# Patient Record
Sex: Female | Born: 2000 | Race: Black or African American | Hispanic: No | Marital: Single | Smoking: Never smoker
Health system: Southern US, Community
[De-identification: ages and names within clinical notes are randomized; demographics above are authoritative.]

## PROBLEM LIST (undated history)

## (undated) ENCOUNTER — Inpatient Hospital Stay (HOSPITAL_COMMUNITY): Payer: Self-pay

## (undated) DIAGNOSIS — A749 Chlamydial infection, unspecified: Secondary | ICD-10-CM

## (undated) DIAGNOSIS — J302 Other seasonal allergic rhinitis: Secondary | ICD-10-CM

## (undated) DIAGNOSIS — R51 Headache: Secondary | ICD-10-CM

## (undated) DIAGNOSIS — K141 Geographic tongue: Secondary | ICD-10-CM

## (undated) DIAGNOSIS — F419 Anxiety disorder, unspecified: Secondary | ICD-10-CM

## (undated) DIAGNOSIS — R519 Headache, unspecified: Secondary | ICD-10-CM

## (undated) DIAGNOSIS — O139 Gestational [pregnancy-induced] hypertension without significant proteinuria, unspecified trimester: Secondary | ICD-10-CM

## (undated) DIAGNOSIS — F32A Depression, unspecified: Secondary | ICD-10-CM

## (undated) HISTORY — PX: EYE SURGERY: SHX253

---
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
## 2020-01-08 ENCOUNTER — Other Ambulatory Visit: Payer: Self-pay

## 2020-01-08 ENCOUNTER — Encounter (HOSPITAL_COMMUNITY): Payer: Self-pay

## 2020-01-08 ENCOUNTER — Ambulatory Visit (HOSPITAL_COMMUNITY)
Admission: EM | Admit: 2020-01-08 | Discharge: 2020-01-08 | Disposition: A | Discharge status: Home / Self Care | Payer: Medicaid Other | Attending: Family Medicine | Admitting: Family Medicine

## 2020-01-08 DIAGNOSIS — Z3202 Encounter for pregnancy test, result negative: Secondary | ICD-10-CM

## 2020-01-08 DIAGNOSIS — R1030 Lower abdominal pain, unspecified: Secondary | ICD-10-CM | POA: Insufficient documentation

## 2020-01-08 DIAGNOSIS — R300 Dysuria: Secondary | ICD-10-CM

## 2020-01-08 DIAGNOSIS — Z113 Encounter for screening for infections with a predominantly sexual mode of transmission: Secondary | ICD-10-CM | POA: Diagnosis not present

## 2020-01-08 DIAGNOSIS — Z202 Contact with and (suspected) exposure to infections with a predominantly sexual mode of transmission: Secondary | ICD-10-CM | POA: Diagnosis not present

## 2020-01-08 LAB — POCT URINALYSIS DIPSTICK, ED / UC
Bilirubin Urine: NEGATIVE
Glucose, UA: NEGATIVE mg/dL
Hgb urine dipstick: NEGATIVE
Ketones, ur: NEGATIVE mg/dL
Nitrite: NEGATIVE
Protein, ur: NEGATIVE mg/dL
Specific Gravity, Urine: >=1.03 (ref 1.005–1.030)
Urobilinogen, UA: 0.2 mg/dL (ref 0.0–1.0)
pH: 6 (ref 5.0–8.0)

## 2020-01-08 LAB — POC URINE PREG, ED: Preg Test, Ur: NEGATIVE

## 2020-01-08 NOTE — ED Provider Notes (Signed)
[facility]    CSN: [id number] Arrival date & time: 01/08/20  1275      History   Chief Complaint Chief Complaint  Patient presents with   Dysuria   Exposure to STD    HPI KALLA WATSON is a 19 y.o. female.   Pt is an 19 year old female that presents with intermittent lower abdominal cramping over the past weeks.  History of UTIs and feels similar.  Otherwise denies any other associated symptoms to include dysuria, hematuria or urinary frequency.  Denies any vaginal discharge, odor, itching or irritation.  Some concern for STDs and would like to be checked.  Not currently having any discomfort.  No fevers, chills, nausea, vomiting or flank pain.     Past Medical History:  Diagnosis Date   Geographical tongue    Headache    Seasonal allergies     Patient Active Problem List   Diagnosis Date Noted   History of pre-eclampsia 12/09/2018    Past Surgical History:  Procedure Laterality Date   EYE SURGERY      OB History    Gravida  1   Para  1   Term  1   Preterm      AB      Living  1     SAB      TAB      Ectopic      Multiple  0   Live Births  1            Home Medications    Prior to Admission medications   Medication Sig Start Date End Date Taking? Authorizing Provider  ibuprofen (ADVIL) 600 MG tablet Take 1 tablet (600 mg total) by mouth every 6 (six) hours as needed for mild pain, moderate pain or cramping. Patient not taking: Reported on 12/09/2018 11/09/18   Cheral Marker, CNM  norelgestromin-ethinyl estradiol (ORTHO EVRA) 150-35 MCG/24HR transdermal patch Place 1 patch onto the skin once a week. 12/09/18   Marny Lowenstein, PA-C  Prenat w/o A-FeCbGl-DSS-FA-DHA [facility] ASSURE) 35-1 & 300 MG tablet One tablet and one capsule daily 11/09/18   Cheral Marker, CNM    Family History Family History  Problem Relation Age of Onset   Healthy Mother    Healthy Father    Hypertension Paternal Grandfather      Social History Social History   Tobacco Use   Smoking status: Never Smoker   Smokeless tobacco: Never Used  Building services engineer Use: Never used  Substance Use Topics   Alcohol use: Never   Drug use: Yes    Types: Marijuana     Allergies   Other   Review of Systems Review of Systems   Physical Exam Triage Vital Signs ED Triage Vitals [01/08/20 1020]  Enc Vitals Group     BP 110/72     Pulse Rate 85     Resp 16     Temp 98.2 F (36.8 C)     Temp src      SpO2 100 %     Weight      Height      Head Circumference      Peak Flow      Pain Score      Pain Loc      Pain Edu?      Excl. in GC?    No data found.  Updated Vital Signs BP 110/72    Pulse 85  Temp 98.2 F (36.8 C)    Resp 16    LMP  (LMP Unknown)    SpO2 100%   Visual Acuity Right Eye Distance:   Left Eye Distance:   Bilateral Distance:    Right Eye Near:   Left Eye Near:    Bilateral Near:     Physical Exam Vitals and nursing note reviewed.  Constitutional:      General: She is not in acute distress.    Appearance: Normal appearance. She is not ill-appearing, toxic-appearing or diaphoretic.  HENT:     Head: Normocephalic.     Nose: Nose normal.  Eyes:     Conjunctiva/sclera: Conjunctivae normal.  Pulmonary:     Effort: Pulmonary effort is normal.  Abdominal:     Palpations: Abdomen is soft.  Musculoskeletal:        General: Normal range of motion.     Cervical back: Normal range of motion.  Skin:    General: Skin is warm and dry.     Findings: No rash.  Neurological:     Mental Status: She is alert.  Psychiatric:        Mood and Affect: Mood normal.      UC Treatments / Results  Labs (all labs ordered are listed, but only abnormal results are displayed) Labs Reviewed  POCT URINALYSIS DIPSTICK, ED / UC - Abnormal; Notable for the following components:      Result Value   Leukocytes,Ua LARGE (*)    All other components within normal limits  URINE CULTURE    POC URINE PREG, ED  CERVICOVAGINAL ANCILLARY ONLY    EKG   Radiology No results found.  Procedures Procedures (including critical care time)  Medications Ordered in UC Medications - No data to display  Initial Impression / Assessment and Plan / UC Course  I have reviewed the triage vital signs and the nursing notes.  Pertinent labs & imaging results that were available during my care of the patient were reviewed by me and considered in my medical decision making (see chart for details).     Lower abdominal discomfort, screening for STDs. Urine with large leuks here.  Sending for culture.  Will treat based on culture Swab sent for STD screening Labs pending Final Clinical Impressions(s) / UC Diagnoses   Final diagnoses:  Screening examination for STD (sexually transmitted disease)  Lower abdominal pain     Discharge Instructions     I am sending a urine for culture and we are sending a swab for testing for STDs, bacteria and yeast.  I will call you with any positive results.     ED Prescriptions    None     PDMP not reviewed this encounter.   Janace Aris, NP 01/08/20 1122

## 2020-01-08 NOTE — ED Triage Notes (Signed)
Pt c/o lower abdominal pain x 2 weeks, hx of UTI's, states feels the same.  Requesting STD testing, denies symptoms.

## 2020-01-08 NOTE — Discharge Instructions (Signed)
I am sending a urine for culture and we are sending a swab for testing for STDs, bacteria and yeast.  I will call you with any positive results.

## 2020-01-10 ENCOUNTER — Telehealth (HOSPITAL_COMMUNITY): Payer: Self-pay | Admitting: Emergency Medicine

## 2020-01-10 LAB — URINE CULTURE: Culture: >=100000 — AB

## 2020-01-10 MED ORDER — NITROFURANTOIN MONOHYD MACRO 100 MG PO CAPS: 100.0000 mg | ORAL_CAPSULE | Freq: Two times a day (BID) | ORAL | 0 refills | Status: DC

## 2020-01-11 ENCOUNTER — Telehealth (HOSPITAL_COMMUNITY): Payer: Self-pay

## 2020-01-11 DIAGNOSIS — B9689 Other specified bacterial agents as the cause of diseases classified elsewhere: Secondary | ICD-10-CM

## 2020-01-11 LAB — CERVICOVAGINAL ANCILLARY ONLY
Bacterial Vaginitis (gardnerella): POSITIVE — AB
Chlamydia: NEGATIVE
Comment: NEGATIVE
Comment: NEGATIVE
Comment: NEGATIVE
Comment: NEGATIVE
Comment: NEGATIVE
Comment: NORMAL
Neisseria Gonorrhea: NEGATIVE

## 2020-01-11 MED ORDER — METRONIDAZOLE 500 MG PO TABS: 500.0000 mg | ORAL_TABLET | Freq: Two times a day (BID) | ORAL | 0 refills | Status: DC

## 2020-01-11 NOTE — Telephone Encounter (Signed)
Attempted to reach patient. No answer at this time. Voicemail left.   Bacterial vaginosis is positive. Pt needs treatment. Flagyl 500 mg BID x 7 days #14 no refills sent to patients pharmacy of choice.

## 2020-02-15 IMAGING — US US MFM OB FOLLOW-UP
1 series · 14 of 28 positions shown · non-contrast
Comparison: none

[Series 1: us mfm ob follow-up · 14 of 39 slices shown]
[im 2/39]
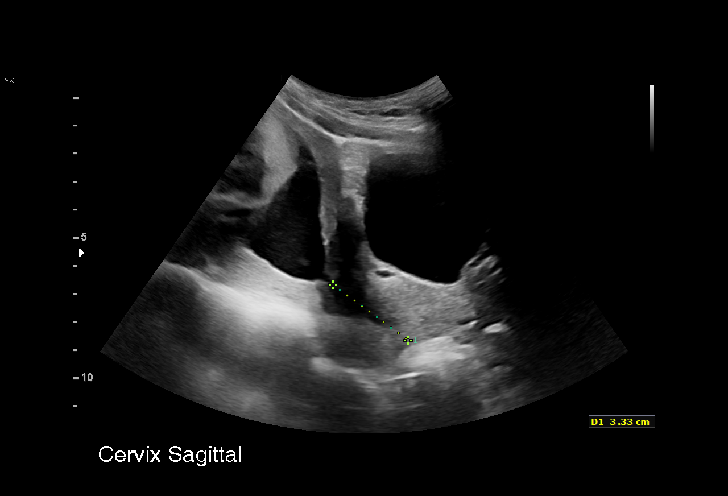
[im 5/39]
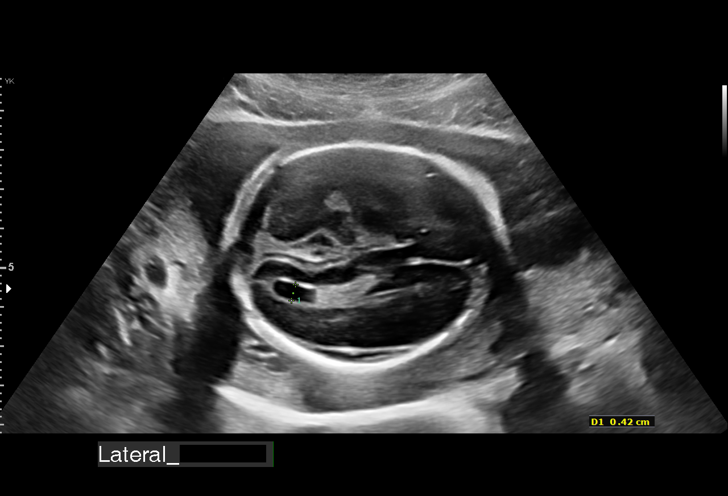
[im 8/39]
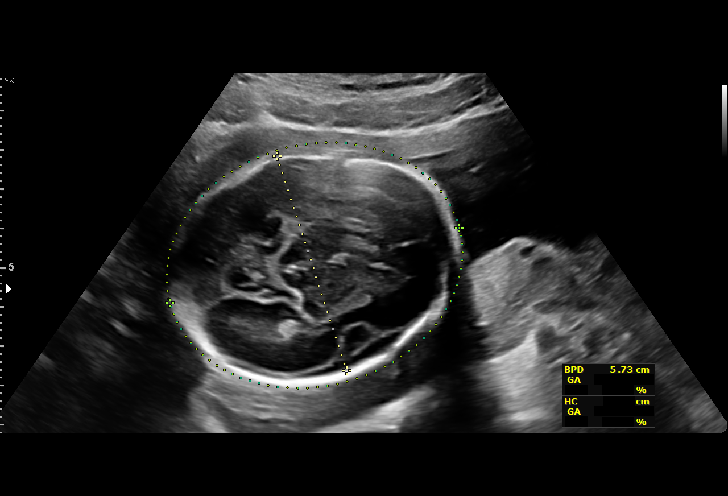
[im 10/39]
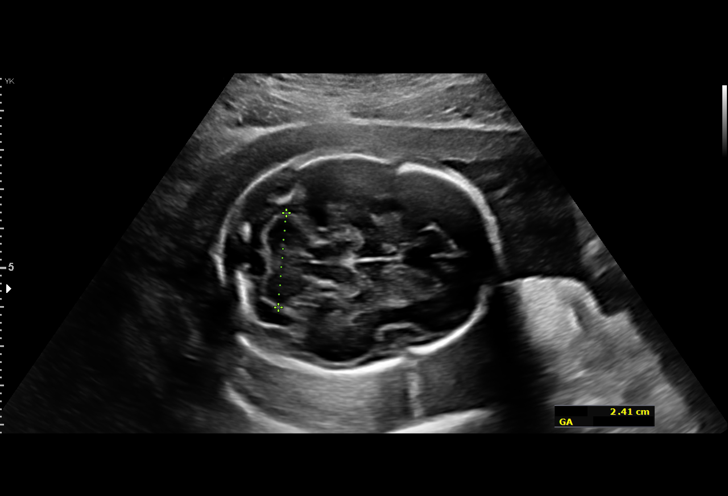
[im 13/39]
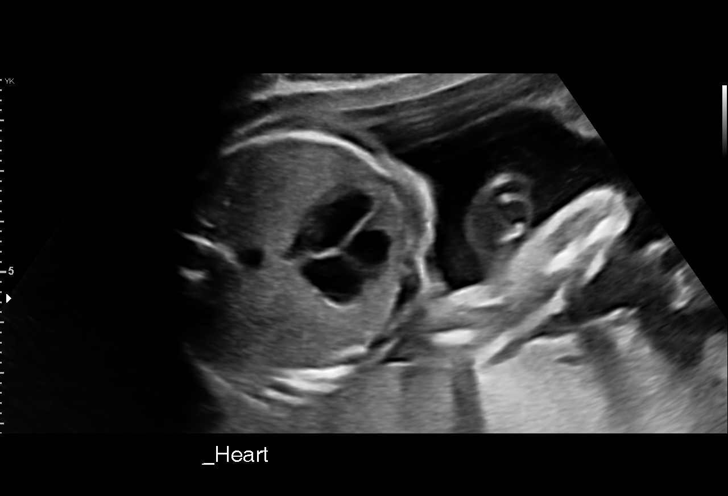
[im 16/39]
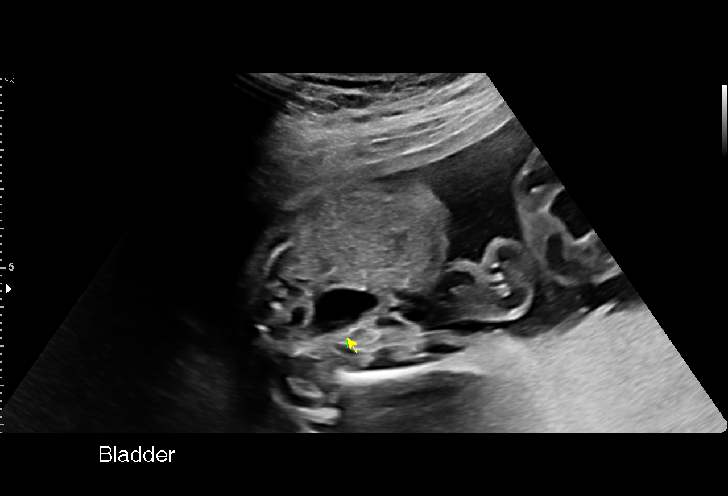
[im 19/39]
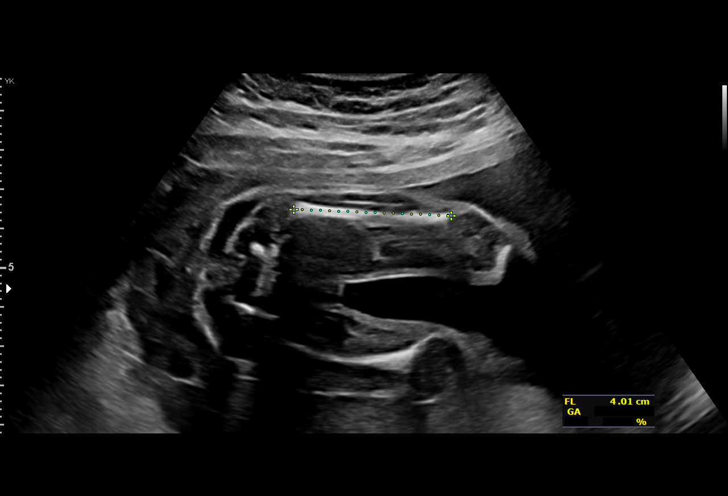
[im 22/39]
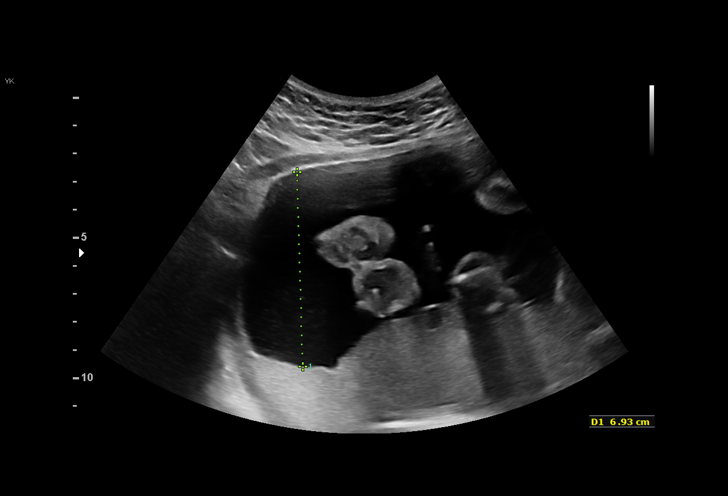
[im 24/39]
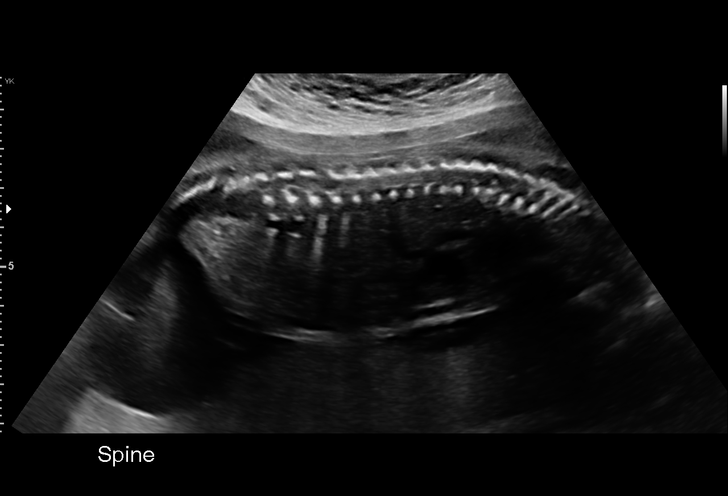
[im 27/39]
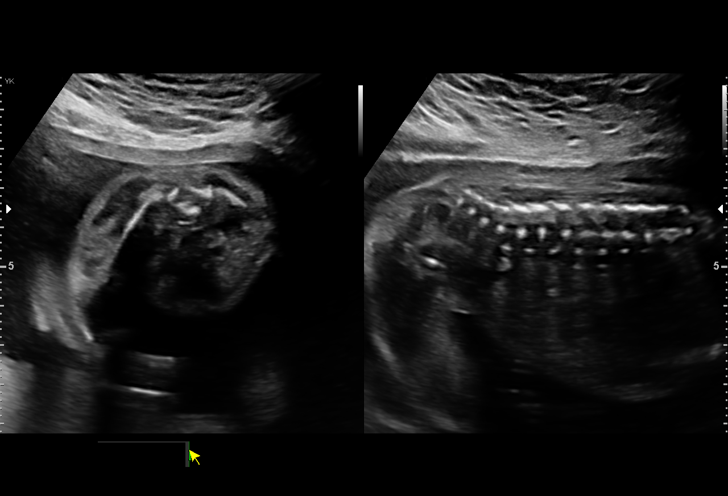
[im 30/39]
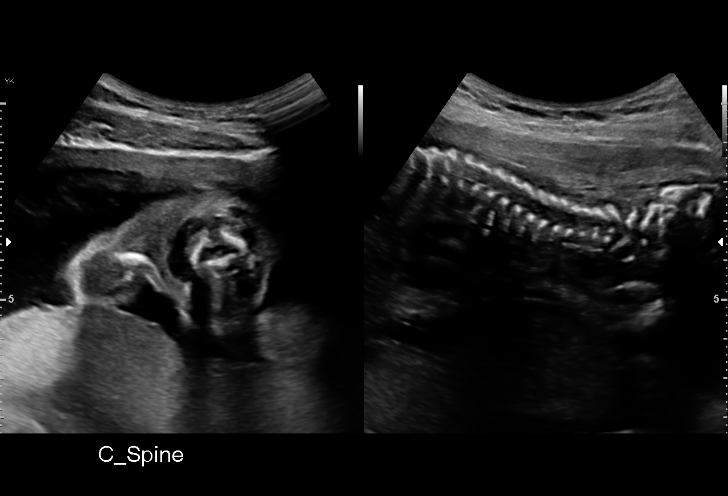
[im 33/39]
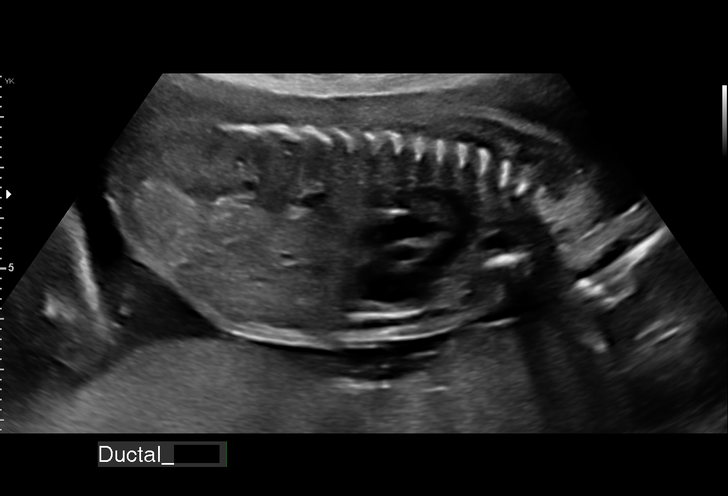
[im 36/39]
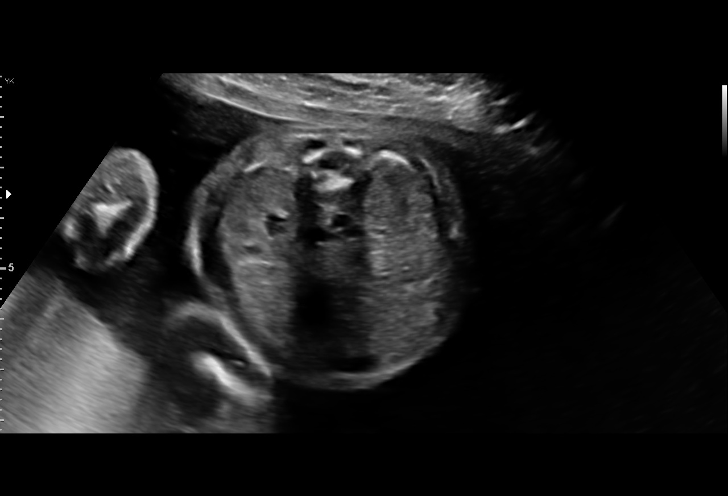
[im 39/39]
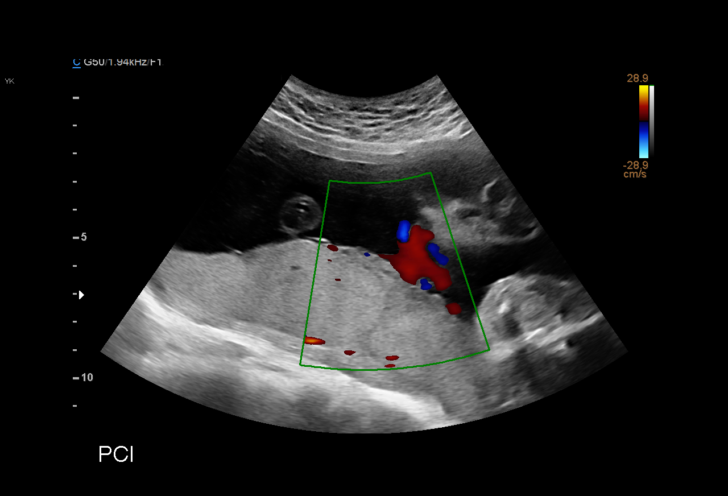

[14 of 28 positions shown; findings below may reference images not displayed]

BLAIN

 ----------------------------------------------------------------------

 ----------------------------------------------------------------------
Indications

  Antenatal follow-up for nonvisualized fetal
  anatomy
  Teen pregnancy
  23 weeks gestation of pregnancy
 ----------------------------------------------------------------------
Vital Signs

                                                 Height:        5'2"
Fetal Evaluation

 Num Of Fetuses:          1
 Fetal Heart Rate(bpm):   150
 Cardiac Activity:        Observed
 Presentation:            Cephalic
 Placenta:                Posterior
 P. Cord Insertion:       Visualized, central

 Amniotic Fluid
 AFI FV:      Within normal limits

                             Largest Pocket(cm)

Biometry

 BPD:      56.4   mm     G. Age:  23w 2d         54  %    CI:         69.15  %    70 - 86
                                                          FL/HC:       18.8  %    19.2 -
 HC:      216.6   mm     G. Age:  23w 5d         63  %    HC/AC:       1.16       1.05 -
 AC:      186.9   mm     G. Age:  23w 3d         57  %    FL/BPD:      72.3  %    71 - 87
 FL:       40.8   mm     G. Age:  23w 2d         46  %    FL/AC:       21.8  %    20 - 24
 CER:      24.1   mm     G. Age:  22w 1d         37  %

 Est. FW:     590   gm      1 lb 5 oz     59  %
OB History

 Gravidity:     1
Gestational Age

 LMP:            23w 0d       Date:  02/11/18                   EDD:  11/18/18
 U/S Today:      23w 3d                                         EDD:  11/15/18
 Best:           23w 0d    Det. By:  LMP  (02/11/18)            EDD:  11/18/18
Anatomy

 Cranium:                Previously seen        Aortic Arch:            Previously seen
 Cavum:                  Previously seen        Ductal Arch:            Appears normal
 Ventricles:             Appears normal         Diaphragm:              Previously seen
 Choroid Plexus:         Previously seen        Stomach:                Appears normal, left
                                                                        sided
 Cerebellum:             Appears normal         Abdomen:                Previously seen
 Posterior Fossa:        Previously seen        Abdominal Wall:         Previously seen
 Nuchal Fold:            Not applicable (>20    Cord Vessels:           Previously seen
                         wks GA)
 Face:                   Appears normal         Kidneys:                Appear normal
                         (orbits and profile)
 Lips:                   Previously seen        Bladder:                Appears normal
 Thoracic:               Appears normal         Spine:                  Appears normal
                         Previously seen
 Heart:                  Appears normal         Upper Extremities:      Previously seen
                         (4CH, axis, and situs)
 RVOT:                   Previously seen        Lower Extremities:      Previously seen
 LVOT:                   Previously seen

 Other:   Male gender. Heels, 5th digit and open hands previously visualized.
Cervix Uterus Adnexa

 Cervix
 Length:             3.3  cm.
 Normal appearance by transabdominal scan.
Impression

 Normal interval growth.
Recommendations

 Follow up as clinically indicated.

## 2020-11-28 ENCOUNTER — Other Ambulatory Visit: Payer: Self-pay

## 2020-11-28 ENCOUNTER — Ambulatory Visit (HOSPITAL_COMMUNITY)
Admission: EM | Admit: 2020-11-28 | Discharge: 2020-11-28 | Disposition: A | Discharge status: Home / Self Care | Payer: Medicaid Other

## 2020-11-28 DIAGNOSIS — Z5321 Procedure and treatment not carried out due to patient leaving prior to being seen by health care provider: Secondary | ICD-10-CM

## 2020-11-28 NOTE — ED Notes (Addendum)
Pt was called four times, I spoke with her Mother Ardine Eng and she stated that her daughter left the campus. She reported to me that the daughter was on her way back.  I informed her that the daughter will have to start the process over because we have called her four times.

## 2020-11-28 NOTE — ED Notes (Signed)
No answer

## 2020-11-30 ENCOUNTER — Encounter (HOSPITAL_COMMUNITY): Payer: Self-pay | Admitting: Emergency Medicine

## 2020-11-30 ENCOUNTER — Other Ambulatory Visit: Payer: Self-pay

## 2020-11-30 ENCOUNTER — Emergency Department (HOSPITAL_COMMUNITY)
Admission: EM | Admit: 2020-11-30 | Discharge: 2020-11-30 | Disposition: A | Discharge status: Home / Self Care | Payer: Medicaid Other | Attending: Emergency Medicine | Admitting: Emergency Medicine

## 2020-11-30 ENCOUNTER — Ambulatory Visit
Admission: EM | Admit: 2020-11-30 | Discharge: 2020-11-30 | Discharge status: Against Medical Advice | Payer: Medicaid Other

## 2020-11-30 DIAGNOSIS — R1031 Right lower quadrant pain: Secondary | ICD-10-CM | POA: Diagnosis not present

## 2020-11-30 DIAGNOSIS — Z5321 Procedure and treatment not carried out due to patient leaving prior to being seen by health care provider: Secondary | ICD-10-CM | POA: Insufficient documentation

## 2020-11-30 LAB — COMPREHENSIVE METABOLIC PANEL
ALT: 13 U/L (ref 0–44)
AST: 20 U/L (ref 15–41)
Albumin: 3.9 g/dL (ref 3.5–5.0)
Alkaline Phosphatase: 51 U/L (ref 38–126)
Anion gap: 7 (ref 5–15)
BUN: 12 mg/dL (ref 6–20)
CO2: 24 mmol/L (ref 22–32)
Calcium: 9.5 mg/dL (ref 8.9–10.3)
Chloride: 105 mmol/L (ref 98–111)
Creatinine, Ser: 0.62 mg/dL (ref 0.44–1.00)
GFR, Estimated: >60 mL/min (ref >60)
Glucose, Bld: 82 mg/dL (ref 70–99)
Potassium: 3.6 mmol/L (ref 3.5–5.1)
Sodium: 136 mmol/L (ref 135–145)
Total Bilirubin: 0.6 mg/dL (ref 0.3–1.2)
Total Protein: 7.7 g/dL (ref 6.5–8.1)

## 2020-11-30 LAB — CBC
HCT: 37.2 % (ref 36.0–46.0)
Hemoglobin: 11.8 g/dL — ABNORMAL LOW (ref 12.0–15.0)
MCH: 28.8 pg (ref 26.0–34.0)
MCHC: 31.7 g/dL (ref 30.0–36.0)
MCV: 90.7 fL (ref 80.0–100.0)
Platelets: 247 10*3/uL (ref 150–400)
RBC: 4.1 MIL/uL (ref 3.87–5.11)
RDW: 12.7 % (ref 11.5–15.5)
WBC: 9.6 10*3/uL (ref 4.0–10.5)
nRBC: 0 % (ref 0.0–0.2)

## 2020-11-30 LAB — I-STAT BETA HCG BLOOD, ED (MC, WL, AP ONLY): I-stat hCG, quantitative: <5 m[IU]/mL (ref <5)

## 2020-11-30 LAB — URINALYSIS, ROUTINE W REFLEX MICROSCOPIC
Bilirubin Urine: NEGATIVE
Glucose, UA: NEGATIVE mg/dL
Hgb urine dipstick: NEGATIVE
Ketones, ur: NEGATIVE mg/dL
Nitrite: POSITIVE — AB
Protein, ur: NEGATIVE mg/dL
Specific Gravity, Urine: 1.023 (ref 1.005–1.030)
pH: 5 (ref 5.0–8.0)

## 2020-11-30 LAB — LIPASE, BLOOD: Lipase: 28 U/L (ref 11–51)

## 2020-11-30 NOTE — ED Notes (Signed)
Pt called x2 for vitals with no response and not visible in lobby or outside on benches

## 2020-11-30 NOTE — ED Triage Notes (Signed)
Pt c/o RLQ abdominal pain with tenderness on palpation x 3 days. Still has appendix. Denies NVD, Denies urinary problems. Denies GI HX, no abd surgeries. Triage VS WDL.     Pt would like to be tested for STDs. No symptoms or concerns, no vaginal discharge/bleeding. States she likes to ask with every ED visit.

## 2020-11-30 NOTE — ED Notes (Signed)
Patient called for room assignment x1 with no response 

## 2020-11-30 NOTE — ED Notes (Signed)
Explained our imaging limitations and pt wants to have eval in ED for appendix pain

## 2021-01-13 ENCOUNTER — Ambulatory Visit (INDEPENDENT_AMBULATORY_CARE_PROVIDER_SITE_OTHER): Payer: Medicaid Other | Admitting: Obstetrics and Gynecology

## 2021-01-13 ENCOUNTER — Other Ambulatory Visit: Payer: Self-pay

## 2021-01-13 ENCOUNTER — Encounter: Payer: Self-pay | Admitting: Obstetrics and Gynecology

## 2021-01-13 VITALS — BP 116/79 | HR 76 | Ht 62.0 in | Wt 152.0 lb

## 2021-01-13 DIAGNOSIS — Z3009 Encounter for other general counseling and advice on contraception: Secondary | ICD-10-CM | POA: Diagnosis not present

## 2021-01-13 DIAGNOSIS — Z01419 Encounter for gynecological examination (general) (routine) without abnormal findings: Secondary | ICD-10-CM

## 2021-01-13 DIAGNOSIS — Z3045 Encounter for surveillance of transdermal patch hormonal contraceptive device: Secondary | ICD-10-CM

## 2021-01-13 LAB — POCT URINE PREGNANCY: Preg Test, Ur: NEGATIVE

## 2021-01-13 MED ORDER — XULANE 150-35 MCG/24HR TD PTWK: 1.0000 | MEDICATED_PATCH | TRANSDERMAL | 4 refills | Status: DC

## 2021-01-13 NOTE — Progress Notes (Signed)
GYNECOLOGY ANNUAL PREVENTATIVE CARE ENCOUNTER NOTE  History:     Sonya Dunn is a 20 y.o. G58P1001 female here for a routine annual gynecologic exam/birth control consult.  Current complaints: none - she would like to start birth control. She has done the patch in the past and would like to resume this.   Denies abnormal vaginal bleeding, discharge, pelvic pain, problems with intercourse or other gynecologic concerns.      Gynecologic History Patient's last menstrual period was 01/07/2021. Contraception: none Last Pap: not yet done - 20 yo.  She has not had Gardasil.  The following portions of the patient's history were reviewed and updated as appropriate: allergies, current medications, past family history, past medical history, past social history, past surgical history and problem list.  Review of Systems Pertinent items noted in HPI and remainder of comprehensive ROS otherwise negative.  Physical Exam:  BP 116/79   Pulse 76   Ht 5\' 2"  (1.575 m)   Wt 152 lb (68.9 kg)   LMP 01/07/2021   BMI 27.80 kg/m  CONSTITUTIONAL: Well-developed, well-nourished female in no acute distress.  HENT:  Normocephalic, atraumatic, External right and left ear normal.  EYES: Conjunctivae and EOM are normal. Pupils are equal, round, and reactive to light. No scleral icterus.  NECK: Normal range of motion, supple, no masses.  Normal thyroid.  SKIN: Skin is warm and dry. No rash noted. Not diaphoretic. No erythema. No pallor. MUSCULOSKELETAL: Normal range of motion. No tenderness.  No cyanosis, clubbing, or edema. NEUROLOGIC: Alert and oriented to person, place, and time. Normal reflexes, muscle tone coordination.  PSYCHIATRIC: Normal mood and affect. Normal behavior. Normal judgment and thought content.  CARDIOVASCULAR: Normal heart rate noted, regular rhythm RESPIRATORY: Clear to auscultation bilaterally. Effort and breath sounds normal, no problems with respiration noted.  BREASTS:  Symmetric in size. No masses, tenderness, skin changes, nipple drainage, or lymphadenopathy bilaterally. Performed in the presence of a chaperone. ABDOMEN: Soft, no distention noted.  No tenderness, rebound or guarding.  PELVIC: deferred. Performed in the presence of a chaperone.    Assessment and Plan:    1. Counseling for initiation of birth control method - Reviewed different types of birth control available: OCPs, vaginal ring, Nexplanon, Depo, various types of IUDs, permanent sterilization.  We reviewed the advantages and risks of each (particularly risk of VTE with estrogen containing options). We discussed side effects of each. - Reviewed that birth control does not protect against STI. Condoms reduce the risk of transmission but are not 100% effective especially for HSV and HIV. - Patient has tried:  patch - Patient desires:  patch - POCT urine pregnancy negative - norelgestromin-ethinyl estradiol 01/09/2021) 150-35 MCG/24HR transdermal patch; Place 1 patch onto the skin once a week.  Dispense: 12 patch; Refill: 4  2. Gyne annual - Cervical cancer screening: Discussed purpose of pap. Recommended first pap at age 63. Discussed screening Q3 years. Reviewed importance of annual exams and limits of pap smear. - Gardasil: has not yet had. Counseling provided. Information given for home review. Recommended vaccination. She will consider.  - Birth Control: Discussed options and their risks, benefits and common side effects; discussed VTE with estrogen containing options. Desires:  patch - Breast Health: Encouraged self breast awareness/exams - teaching provided. - Follow-up: 12 months and prn  Routine preventative health maintenance measures emphasized. Please refer to After Visit Summary for other counseling recommendations.      36, MD, FACOG Obstetrician & Gynecologist, Faculty  [facility] [employer], [place]

## 2021-01-13 NOTE — Progress Notes (Signed)
Pt states she is interested in Patch for [facility], pt is on cycle now- started last week. Pt states last intercourse beginning of August.   UPT is Negative in office today.

## 2021-02-03 ENCOUNTER — Ambulatory Visit: Payer: Medicaid Other | Admitting: Family Medicine

## 2021-02-20 ENCOUNTER — Other Ambulatory Visit: Payer: Self-pay

## 2021-02-20 ENCOUNTER — Ambulatory Visit (INDEPENDENT_AMBULATORY_CARE_PROVIDER_SITE_OTHER): Payer: Medicaid Other

## 2021-02-20 DIAGNOSIS — Z3200 Encounter for pregnancy test, result unknown: Secondary | ICD-10-CM

## 2021-02-20 DIAGNOSIS — Z3202 Encounter for pregnancy test, result negative: Secondary | ICD-10-CM

## 2021-02-20 DIAGNOSIS — R309 Painful micturition, unspecified: Secondary | ICD-10-CM

## 2021-02-20 DIAGNOSIS — R300 Dysuria: Secondary | ICD-10-CM

## 2021-02-20 LAB — POCT URINE PREGNANCY: Preg Test, Ur: NEGATIVE

## 2021-02-20 NOTE — Progress Notes (Signed)
Pt is in the office for UPT for possible pregnancy. UPT is negative, pt is on the [facility] patch but states that last cycle was different. Advised pt on how to properly use/change BC patches, and if cycle does not come on, take a home UPT, pt agreed. Pt also reports pain with urination, advised would send urine culture.

## 2021-02-20 NOTE — Progress Notes (Signed)
Patient was assessed and managed by nursing staff during this encounter. I have reviewed the chart and agree with the documentation and plan. I have also made any necessary editorial changes.  Catalina Antigua, MD 02/20/2021 3:24 PM

## 2021-02-24 LAB — URINE CULTURE

## 2021-03-29 ENCOUNTER — Ambulatory Visit (HOSPITAL_COMMUNITY)
Admission: EM | Admit: 2021-03-29 | Discharge: 2021-03-29 | Disposition: A | Discharge status: Home / Self Care | Payer: Medicaid Other | Attending: Family Medicine | Admitting: Family Medicine

## 2021-03-29 ENCOUNTER — Other Ambulatory Visit: Payer: Self-pay

## 2021-03-29 ENCOUNTER — Encounter (HOSPITAL_COMMUNITY): Payer: Self-pay

## 2021-03-29 DIAGNOSIS — J069 Acute upper respiratory infection, unspecified: Secondary | ICD-10-CM

## 2021-03-29 MED ORDER — OSELTAMIVIR PHOSPHATE 75 MG PO CAPS: 75.0000 mg | ORAL_CAPSULE | Freq: Two times a day (BID) | ORAL | 0 refills | Status: DC

## 2021-03-29 MED ORDER — PROMETHAZINE-DM 6.25-15 MG/5ML PO SYRP: 5.0000 mL | ORAL_SOLUTION | Freq: Four times a day (QID) | ORAL | 0 refills | Status: DC | PRN

## 2021-03-29 NOTE — ED Provider Notes (Signed)
[facility]    CSN: [id number] Arrival date & time: 03/29/21  1350      History   Chief Complaint Chief Complaint  Patient presents with   Sore Throat   Fever   Headache    HPI Sonya Dunn is a 20 y.o. female.   Presenting today with 1 day history of fever, chills, body aches, sore throat, cough, congestion.  Denies chest pain, shortness of breath, abdominal pain, nausea vomiting or diarrhea.  No known chronic medical problems.  So far not trying anything over-the-counter for symptoms.  Son sick with similar symptoms.   Past Medical History:  Diagnosis Date   Geographical tongue    Headache    Seasonal allergies     Patient Active Problem List   Diagnosis Date Noted   History of pre-eclampsia 12/09/2018    Past Surgical History:  Procedure Laterality Date   EYE SURGERY      OB History     Gravida  1   Para  1   Term  1   Preterm      AB      Living  1      SAB      IAB      Ectopic      Multiple  0   Live Births  1            Home Medications    Prior to Admission medications   Medication Sig Start Date End Date Taking? Authorizing Provider  oseltamivir (TAMIFLU) 75 MG capsule Take 1 capsule (75 mg total) by mouth every 12 (twelve) hours. 03/29/21  Yes Particia Nearing, PA-C  promethazine-dextromethorphan (PROMETHAZINE-DM) 6.25-15 MG/5ML syrup Take 5 mLs by mouth 4 (four) times daily as needed for cough. 03/29/21  Yes Particia Nearing, PA-C  ibuprofen (ADVIL) 600 MG tablet Take 1 tablet (600 mg total) by mouth every 6 (six) hours as needed for mild pain, moderate pain or cramping. Patient not taking: Reported on 12/09/2018 11/09/18   Cheral Marker, CNM  norelgestromin-ethinyl estradiol [facility]) 150-35 MCG/24HR transdermal patch Place 1 patch onto the skin once a week. 01/13/21   Milas Hock, MD    Family History Family History  Problem Relation Age of Onset   Healthy Mother    Healthy Father     Hypertension Paternal Grandfather     Social History Social History   Tobacco Use   Smoking status: Never   Smokeless tobacco: Never  Vaping Use   Vaping Use: Never used  Substance Use Topics   Alcohol use: Never   Drug use: Not Currently    Types: Marijuana     Allergies   Other   Review of Systems Review of Systems Per HPI  Physical Exam Triage Vital Signs ED Triage Vitals  Enc Vitals Group     BP 03/29/21 1601 117/79     Pulse Rate 03/29/21 1601 (!) 117     Resp 03/29/21 1601 19     Temp 03/29/21 1601 99.5 F (37.5 C)     Temp Source 03/29/21 1601 Oral     SpO2 03/29/21 1601 95 %     Weight --      Height --      Head Circumference --      Peak Flow --      Pain Score 03/29/21 1600 6     Pain Loc --      Pain Edu? --  Excl. in GC? --    No data found.  Updated Vital Signs BP 117/79 (BP Location: Right Arm)   Pulse (!) 117   Temp 99.5 F (37.5 C) (Oral)   Resp 19   LMP 03/20/2021 (Exact Date)   SpO2 95%   Visual Acuity Right Eye Distance:   Left Eye Distance:   Bilateral Distance:    Right Eye Near:   Left Eye Near:    Bilateral Near:     Physical Exam Vitals and nursing note reviewed.  Constitutional:      Appearance: Normal appearance. She is not ill-appearing.  HENT:     Head: Atraumatic.     Right Ear: Tympanic membrane normal.     Left Ear: Tympanic membrane normal.     Nose: Rhinorrhea present.     Mouth/Throat:     Mouth: Mucous membranes are moist.     Pharynx: Oropharynx is clear. Posterior oropharyngeal erythema present. No oropharyngeal exudate.  Eyes:     Extraocular Movements: Extraocular movements intact.     Conjunctiva/sclera: Conjunctivae normal.  Cardiovascular:     Rate and Rhythm: Normal rate and regular rhythm.     Heart sounds: Normal heart sounds.  Pulmonary:     Effort: Pulmonary effort is normal. No respiratory distress.     Breath sounds: Normal breath sounds. No wheezing or rales.  Musculoskeletal:         General: Normal range of motion.     Cervical back: Normal range of motion and neck supple.  Skin:    General: Skin is warm and dry.     Findings: No bruising or lesion.  Neurological:     Mental Status: She is alert and oriented to person, place, and time.     Motor: No weakness.     Gait: Gait normal.  Psychiatric:        Mood and Affect: Mood normal.        Thought Content: Thought content normal.        Judgment: Judgment normal.     UC Treatments / Results  Labs (all labs ordered are listed, but only abnormal results are displayed) Labs Reviewed - No data to display  EKG   Radiology No results found.  Procedures Procedures (including critical care time)  Medications Ordered in UC Medications - No data to display  Initial Impression / Assessment and Plan / UC Course  I have reviewed the triage vital signs and the nursing notes.  Pertinent labs & imaging results that were available during my care of the patient were reviewed by me and considered in my medical decision making (see chart for details).     Tachycardic in triage, likely secondary to viral illness.  Strongly suspect influenza given symptoms and community exposures.  Will start Tamiflu, Phenergan DM, discussed over-the-counter supportive medications and home care.  Return for acutely worsening symptoms.  Work note given.  Forego viral swab today as clinical picture is consistent with influenza.  Final Clinical Impressions(s) / UC Diagnoses   Final diagnoses:  Viral URI with cough   Discharge Instructions   None    ED Prescriptions     Medication Sig Dispense Auth. Provider   oseltamivir (TAMIFLU) 75 MG capsule Take 1 capsule (75 mg total) by mouth every 12 (twelve) hours. 10 capsule Particia Nearing, [place]   promethazine-dextromethorphan (PROMETHAZINE-DM) 6.25-15 MG/5ML syrup Take 5 mLs by mouth 4 (four) times daily as needed for cough. 100 mL Particia Nearing, [place]  PDMP  not reviewed this encounter.   Particia Nearing, [place] 03/29/21 1654

## 2021-03-29 NOTE — ED Triage Notes (Signed)
Pt presents with fever, sore throat and body aches x 24 hours.

## 2021-04-07 ENCOUNTER — Other Ambulatory Visit: Payer: Self-pay

## 2021-04-07 ENCOUNTER — Encounter (HOSPITAL_COMMUNITY): Payer: Self-pay | Admitting: Emergency Medicine

## 2021-04-07 ENCOUNTER — Ambulatory Visit (HOSPITAL_COMMUNITY)
Admission: EM | Admit: 2021-04-07 | Discharge: 2021-04-07 | Disposition: A | Discharge status: Home / Self Care | Payer: Medicaid Other | Attending: Internal Medicine | Admitting: Internal Medicine

## 2021-04-07 DIAGNOSIS — J36 Peritonsillar abscess: Secondary | ICD-10-CM | POA: Diagnosis present

## 2021-04-07 DIAGNOSIS — Z20822 Contact with and (suspected) exposure to covid-19: Secondary | ICD-10-CM | POA: Insufficient documentation

## 2021-04-07 LAB — POCT RAPID STREP A, ED / UC: Streptococcus, Group A Screen (Direct): NEGATIVE

## 2021-04-07 MED ORDER — AMOXICILLIN-POT CLAVULANATE 875-125 MG PO TABS: 1.0000 | ORAL_TABLET | Freq: Two times a day (BID) | ORAL | 0 refills | Status: AC

## 2021-04-07 MED ORDER — METHYLPREDNISOLONE SODIUM SUCC 125 MG IJ SOLR
INTRAMUSCULAR | Status: AC
  Filled 2021-04-07: qty 2

## 2021-04-07 MED ORDER — METHYLPREDNISOLONE SODIUM SUCC 125 MG IJ SOLR
60.0000 mg | Freq: Once | INTRAMUSCULAR | Status: AC
  Administered 2021-04-07: 60 mg via INTRAMUSCULAR

## 2021-04-07 MED ORDER — PREDNISONE 20 MG PO TABS: 20.0000 mg | ORAL_TABLET | Freq: Every day | ORAL | 0 refills | Status: AC

## 2021-04-07 NOTE — ED Notes (Signed)
Called patient to come in from parking lot

## 2021-04-07 NOTE — ED Provider Notes (Signed)
[facility]    CSN: [id number] Arrival date & time: 04/07/21  1230      History   Chief Complaint Chief Complaint  Patient presents with   Sore Throat    HPI Sonya Dunn is a 20 y.o. female comes to the urgent care with worsening sore throat, difficulty swallowing and difficulty breathing.  Patient was seen in the urgent care about a week and a half ago and was empirically treated for influenza infection.  Patient has completed a course of Tamiflu with no significant improvement.  She started having worsening sore throat and noisy breathing a couple of days ago.  No wheezing or difficulty swallowing.  No chest tightness or chest pain.  HPI  Past Medical History:  Diagnosis Date   Geographical tongue    Headache    Seasonal allergies     Patient Active Problem List   Diagnosis Date Noted   History of pre-eclampsia 12/09/2018    Past Surgical History:  Procedure Laterality Date   EYE SURGERY      OB History     Gravida  1   Para  1   Term  1   Preterm      AB      Living  1      SAB      IAB      Ectopic      Multiple  0   Live Births  1            Home Medications    Prior to Admission medications   Medication Sig Start Date End Date Taking? Authorizing Provider  amoxicillin-clavulanate (AUGMENTIN) 875-125 MG tablet Take 1 tablet by mouth every 12 (twelve) hours for 5 days. 04/07/21 04/12/21 Yes Bannon Giammarco, Britta Mccreedy, MD  predniSONE (DELTASONE) 20 MG tablet Take 1 tablet (20 mg total) by mouth daily for 5 days. 04/07/21 04/12/21 Yes Ellyse Rotolo, Britta Mccreedy, MD  norelgestromin-ethinyl estradiol [facility]) 150-35 MCG/24HR transdermal patch Place 1 patch onto the skin once a week. 01/13/21   Milas Hock, MD  oseltamivir (TAMIFLU) 75 MG capsule Take 1 capsule (75 mg total) by mouth every 12 (twelve) hours. 03/29/21   Particia Nearing, PA-C  promethazine-dextromethorphan (PROMETHAZINE-DM) 6.25-15 MG/5ML syrup Take 5 mLs by mouth 4  (four) times daily as needed for cough. 03/29/21   Particia Nearing, PA-C    Family History Family History  Problem Relation Age of Onset   Healthy Mother    Healthy Father    Hypertension Paternal Grandfather     Social History Social History   Tobacco Use   Smoking status: Never   Smokeless tobacco: Never  Vaping Use   Vaping Use: Never used  Substance Use Topics   Alcohol use: Never   Drug use: Not Currently    Types: Marijuana     Allergies   Other   Review of Systems Review of Systems  Constitutional: Negative.   HENT:  Positive for sore throat. Negative for dental problem and drooling.   Respiratory:  Negative for chest tightness and shortness of breath.   Cardiovascular:  Negative for chest pain.  Gastrointestinal: Negative.   Neurological: Negative.     Physical Exam Triage Vital Signs ED Triage Vitals  Enc Vitals Group     BP 04/07/21 1307 109/75     Pulse Rate 04/07/21 1307 94     Resp 04/07/21 1307 16     Temp 04/07/21 1307 98.3 F (36.8 C)  Temp Source 04/07/21 1307 Oral     SpO2 04/07/21 1307 97 %     Weight --      Height --      Head Circumference --      Peak Flow --      Pain Score 04/07/21 1303 6     Pain Loc --      Pain Edu? --      Excl. in GC? --    No data found.  Updated Vital Signs BP 109/75 (BP Location: Left Arm)   Pulse 94   Temp 98.3 F (36.8 C) (Oral)   Resp 16   LMP 03/20/2021 (Exact Date) Comment: birth control patch  SpO2 97%   Visual Acuity Right Eye Distance:   Left Eye Distance:   Bilateral Distance:    Right Eye Near:   Left Eye Near:    Bilateral Near:     Physical Exam Vitals and nursing note reviewed.  Constitutional:      General: She is not in acute distress.    Appearance: She is not ill-appearing.  HENT:     Mouth/Throat:     Mouth: Mucous membranes are moist. Mucous membranes are pale.     Pharynx: Posterior oropharyngeal erythema present.     Tonsils: No tonsillar exudate or  tonsillar abscesses. 2+ on the right. 2+ on the left.  Cardiovascular:     Rate and Rhythm: Normal rate and regular rhythm.  Pulmonary:     Effort: Pulmonary effort is normal.     Breath sounds: Normal breath sounds. No stridor.  Neurological:     Mental Status: She is alert.     UC Treatments / Results  Labs (all labs ordered are listed, but only abnormal results are displayed) Labs Reviewed  SARS CORONAVIRUS 2 (TAT 6-24 HRS)  POCT RAPID STREP A, ED / UC    EKG   Radiology No results found.  Procedures Procedures (including critical care time)  Medications Ordered in UC Medications  methylPREDNISolone sodium succinate (SOLU-MEDROL) 125 mg/2 mL injection 60 mg (60 mg Intramuscular Given 04/07/21 1434)    Initial Impression / Assessment and Plan / UC Course  I have reviewed the triage vital signs and the nursing notes.  Pertinent labs & imaging results that were available during my care of the patient were reviewed by me and considered in my medical decision making (see chart for details).     1.  Peritonsillitis: Warm salt water gargle Solu-Medrol 60 mg IM x1 dose Augmentin twice daily for 5 days Prednisone 20 mg orally daily for 5 days Return to urgent care if symptoms worsen Return to the emergency department if you have noisy breathing with difficulty breathing. Final Clinical Impressions(s) / UC Diagnoses   Final diagnoses:  Peritonsillitis     Discharge Instructions      Please take medications as prescribed Maintain adequate hydration If symptoms worsen, please return to urgent care to be re-evaluated If you develop noisy breathing ,please go to the ED immediately to be re-evaluated. Warm salt water gargle recommended.   ED Prescriptions     Medication Sig Dispense Auth. Provider   amoxicillin-clavulanate (AUGMENTIN) 875-125 MG tablet Take 1 tablet by mouth every 12 (twelve) hours for 5 days. 10 tablet Ople Girgis, Britta Mccreedy, MD   predniSONE  (DELTASONE) 20 MG tablet Take 1 tablet (20 mg total) by mouth daily for 5 days. 5 tablet Yecheskel Kurek, Britta Mccreedy, MD      PDMP not reviewed this  encounter.   Merrilee Jansky, MD 04/07/21 [phone]

## 2021-04-07 NOTE — Discharge Instructions (Addendum)
Please take medications as prescribed Maintain adequate hydration If symptoms worsen, please return to urgent care to be re-evaluated If you develop noisy breathing ,please go to the ED immediately to be re-evaluated. Warm salt water gargle recommended.

## 2021-04-07 NOTE — ED Triage Notes (Signed)
Concerned for blood pressure spikes for last 2 days.  Patient has had a sore throat for the last 3-4 days.  Patient seen and diagnosed with flu on 03/29/2021

## 2021-04-08 ENCOUNTER — Other Ambulatory Visit: Payer: Self-pay

## 2021-04-08 ENCOUNTER — Emergency Department (HOSPITAL_COMMUNITY)
Admission: EM | Admit: 2021-04-08 | Discharge: 2021-04-09 | Disposition: A | Discharge status: Home / Self Care | Payer: Medicaid Other | Attending: Emergency Medicine | Admitting: Emergency Medicine

## 2021-04-08 ENCOUNTER — Encounter (HOSPITAL_COMMUNITY): Payer: Self-pay | Admitting: Emergency Medicine

## 2021-04-08 DIAGNOSIS — R110 Nausea: Secondary | ICD-10-CM | POA: Diagnosis not present

## 2021-04-08 DIAGNOSIS — J029 Acute pharyngitis, unspecified: Secondary | ICD-10-CM | POA: Insufficient documentation

## 2021-04-08 DIAGNOSIS — R059 Cough, unspecified: Secondary | ICD-10-CM | POA: Insufficient documentation

## 2021-04-08 DIAGNOSIS — R509 Fever, unspecified: Secondary | ICD-10-CM | POA: Insufficient documentation

## 2021-04-08 DIAGNOSIS — R519 Headache, unspecified: Secondary | ICD-10-CM | POA: Diagnosis present

## 2021-04-08 LAB — SARS CORONAVIRUS 2 (TAT 6-24 HRS): SARS Coronavirus 2: NEGATIVE

## 2021-04-08 NOTE — ED Triage Notes (Signed)
Pt presents with headache and throat pain. Was seen at urgent care and had negative strep and covid/flu. Pt states she was given anbx but hasn't picked them up

## 2021-04-08 NOTE — ED Provider Notes (Signed)
Emergency Medicine Provider Triage Evaluation Note  Sonya Dunn , a 20 y.o. female  was evaluated in triage.  Pt complains of migraine headache and swollen tonsils.  Review of Systems  Positive: Headache and photophobia Negative: Fever  Physical Exam  BP 133/82 (BP Location: Left Arm)   Pulse 98   Temp 99.6 F (37.6 C) (Oral)   Resp 16   Ht 5\' 2"  (1.575 m)   Wt 69.4 kg   LMP 03/20/2021 (Exact Date) Comment: birth control patch  SpO2 100%   BMI 27.98 kg/m  Gen:   Awake, no distress   Resp:  Normal effort  MSK:   Moves extremities without difficulty  Other:  Oropharynx with enlarged tonsils but symmetric.  No uvular deviation  Medical Decision Making  Medically screening exam initiated at 8:42 PM.  Appropriate orders placed.  Sonya Dunn was informed that the remainder of the evaluation will be completed by another provider, this initial triage assessment does not replace that evaluation, and the importance of remaining in the ED until their evaluation is complete.     [name], MD 04/09/21 [phone]

## 2021-04-09 MED ORDER — DIPHENHYDRAMINE HCL 50 MG/ML IJ SOLN
25.0000 mg | Freq: Once | INTRAMUSCULAR | Status: AC
  Administered 2021-04-09: 25 mg via INTRAVENOUS
  Filled 2021-04-09: qty 1

## 2021-04-09 MED ORDER — PROCHLORPERAZINE EDISYLATE 10 MG/2ML IJ SOLN
10.0000 mg | Freq: Once | INTRAMUSCULAR | Status: AC
  Administered 2021-04-09: 10 mg via INTRAVENOUS
  Filled 2021-04-09: qty 2

## 2021-04-09 MED ORDER — KETOROLAC TROMETHAMINE 15 MG/ML IJ SOLN
15.0000 mg | Freq: Once | INTRAMUSCULAR | Status: AC
  Administered 2021-04-09: 15 mg via INTRAVENOUS
  Filled 2021-04-09: qty 1

## 2021-04-09 MED ORDER — SODIUM CHLORIDE 0.9 % IV BOLUS
1000.0000 mL | Freq: Once | INTRAVENOUS | Status: AC
  Administered 2021-04-09: 1000 mL via INTRAVENOUS

## 2021-04-09 NOTE — Discharge Instructions (Signed)
You were evaluated in the Emergency Department and after careful evaluation, we did not find any emergent condition requiring admission or further testing in the hospital.  Your exam/testing today was overall reassuring.  It is important that you fill your prescriptions for Augmentin and prednisone.  Please return to the Emergency Department if you experience any worsening of your condition.  Thank you for allowing [place] to be a part of your care.

## 2021-04-09 NOTE — ED Provider Notes (Signed)
[facility] Emergency Department Provider Note MRN:  [id number]  Arrival date & time: 04/09/21     Chief Complaint   Headache and Sore Throat   History of Present Illness   Sonya Dunn is a 20 y.o. year-old female with no pertinent past medical history presenting to the ED with chief complaint of headache.  Location: [facility] Duration: Couple days Onset: Gradual Timing: Constant Description: Ache Severity: Moderate to severe Exacerbating/Alleviating Factors: Worse with Dunn lights and loud noises Associated Symptoms: Nausea, fever,, cough, also has been having sore throat which is improving Pertinent Negatives: No neck pain or stiffness, no chest pain or shortness of breath, no abdominal pain, no numbness or weakness to the arms or legs  Additional History: None  Review of Systems  A complete 10 system review of systems was obtained and all systems are negative except as noted in the HPI and PMH.   Patient's Health History    Past Medical History:  Diagnosis Date   Geographical tongue    Headache    Seasonal allergies     Past Surgical History:  Procedure Laterality Date   EYE SURGERY      Family History  Problem Relation Age of Onset   Healthy Mother    Healthy Father    Hypertension Paternal Grandfather     Social History   Socioeconomic History   Marital status: Single    Spouse name: Not on file   Number of children: Not on file   Years of education: Not on file   Highest education level: Not on file  Occupational History   Not on file  Tobacco Use   Smoking status: Never   Smokeless tobacco: Never  Vaping Use   Vaping Use: Never used  Substance and Sexual Activity   Alcohol use: Never   Drug use: Not Currently    Types: Marijuana   Sexual activity: Yes    Partners: Male    Birth control/protection: None  Other Topics Concern   Not on file  Social History Narrative   Not on file   Social Determinants of  Health   Financial Resource Strain: Not on file  Food Insecurity: Not on file  Transportation Needs: Not on file  Physical Activity: Not on file  Stress: Not on file  Social Connections: Not on file  Intimate Partner Violence: Not on file     Physical Exam   Vitals:   04/09/21 0600 04/09/21 0630  BP: 121/78 99/66  Pulse: (!) 106 60  Resp:  19  Temp:  100.2 F (37.9 C)  SpO2: 97% 94%    CONSTITUTIONAL: Well-appearing, NAD NEURO:  Alert and oriented x 3, no focal deficits, no meningismus EYES:  eyes equal and reactive ENT/NECK:  no LAD, no JVD CARDIO: Regular rate, well-perfused, normal S1 and S2 PULM:  CTAB no wheezing or rhonchi GI/GU:  normal bowel sounds, non-distended, non-tender MSK/SPINE:  No gross deformities, no edema SKIN:  no rash, atraumatic PSYCH:  Appropriate speech and behavior  *Additional and/or pertinent findings included in MDM below  Diagnostic and Interventional Summary    EKG Interpretation  Date/Time:    Ventricular Rate:    PR Interval:    QRS Duration:   QT Interval:    QTC Calculation:   R Axis:     Text Interpretation:         Labs Reviewed - No data to display  No orders to display    Medications  ketorolac (  TORADOL) 15 MG/ML injection 15 mg (15 mg Intravenous Given 04/09/21 0601)  diphenhydrAMINE (BENADRYL) injection 25 mg (25 mg Intravenous Given 04/09/21 0600)  prochlorperazine (COMPAZINE) injection 10 mg (10 mg Intravenous Given 04/09/21 0601)  sodium chloride 0.9 % bolus 1,000 mL (1,000 mLs Intravenous New Bag/Given 04/09/21 0604)     Procedures  /  Critical Care Procedures  ED Course and Medical Decision Making  I have reviewed the triage vital signs, the nursing notes, and pertinent available records from the EMR.  Listed above are laboratory and imaging tests that I personally ordered, reviewed, and interpreted and then considered in my medical decision making (see below for details).  History of migraines here  with a similar headache, all in the setting of recent sore throat, cough, fever, suspect viral illness.  Has tested negative for flu, COVID recently at urgent care.  Recently prescribed Augmentin and prednisone but has not filled them yet.  Providing migraine cocktail and will reassess.  Doubt any significant intracranial pathology such as subarachnoid hemorrhage, no meningismus, doubt meningitis.     Patient feeling better after migraine cocktail.  Tonsils are quite swollen but there is no airway concerns at this time, advised to fill prescriptions.  Appropriate for discharge.  Elmer Sow. Pilar Plate, MD [facility] [email or web address]  Final Clinical Impressions(s) / ED Diagnoses     ICD-10-CM   1. Nonintractable headache, unspecified chronicity pattern, unspecified headache type  R51.9       ED Discharge Orders     None        Discharge Instructions Discussed with and Provided to Patient:     Discharge Instructions      You were evaluated in the Emergency Department and after careful evaluation, we did not find any emergent condition requiring admission or further testing in the hospital.  Your exam/testing today was overall reassuring.  It is important that you fill your prescriptions for Augmentin and prednisone.  Please return to the Emergency Department if you experience any worsening of your condition.  Thank you for allowing [place] to be a part of your care.         Sabas Sous, MD 04/09/21 [phone]

## 2021-04-09 NOTE — ED Notes (Signed)
Notified nurse about patient temp 100.8

## 2021-05-28 DIAGNOSIS — R0602 Shortness of breath: Secondary | ICD-10-CM | POA: Diagnosis not present

## 2021-05-28 DIAGNOSIS — R0781 Pleurodynia: Secondary | ICD-10-CM | POA: Diagnosis not present

## 2021-05-28 DIAGNOSIS — R002 Palpitations: Secondary | ICD-10-CM | POA: Diagnosis not present

## 2021-05-28 DIAGNOSIS — R1011 Right upper quadrant pain: Secondary | ICD-10-CM | POA: Insufficient documentation

## 2021-05-28 DIAGNOSIS — Z5321 Procedure and treatment not carried out due to patient leaving prior to being seen by health care provider: Secondary | ICD-10-CM | POA: Diagnosis not present

## 2021-05-29 ENCOUNTER — Encounter (HOSPITAL_COMMUNITY): Payer: Self-pay

## 2021-05-29 ENCOUNTER — Ambulatory Visit
Admission: EM | Admit: 2021-05-29 | Discharge: 2021-05-29 | Discharge status: Against Medical Advice | Payer: Medicaid Other

## 2021-05-29 ENCOUNTER — Emergency Department (HOSPITAL_COMMUNITY): Payer: Medicaid Other

## 2021-05-29 ENCOUNTER — Other Ambulatory Visit: Payer: Self-pay

## 2021-05-29 ENCOUNTER — Emergency Department (HOSPITAL_COMMUNITY)
Admission: EM | Admit: 2021-05-29 | Discharge: 2021-05-29 | Discharge status: Against Medical Advice | Payer: Medicaid Other | Attending: Medical | Admitting: Medical

## 2021-05-29 LAB — CBC WITH DIFFERENTIAL/PLATELET
Abs Immature Granulocytes: 0.01 10*3/uL (ref 0.00–0.07)
Basophils Absolute: 0 10*3/uL (ref 0.0–0.1)
Basophils Relative: 0 %
Eosinophils Absolute: 0.3 10*3/uL (ref 0.0–0.5)
Eosinophils Relative: 3 %
HCT: 33.1 % — ABNORMAL LOW (ref 36.0–46.0)
Hemoglobin: 10.5 g/dL — ABNORMAL LOW (ref 12.0–15.0)
Immature Granulocytes: 0 %
Lymphocytes Relative: 44 %
Lymphs Abs: 3.3 10*3/uL (ref 0.7–4.0)
MCH: 28.9 pg (ref 26.0–34.0)
MCHC: 31.7 g/dL (ref 30.0–36.0)
MCV: 91.2 fL (ref 80.0–100.0)
Monocytes Absolute: 0.5 10*3/uL (ref 0.1–1.0)
Monocytes Relative: 6 %
Neutro Abs: 3.5 10*3/uL (ref 1.7–7.7)
Neutrophils Relative %: 47 %
Platelets: 279 10*3/uL (ref 150–400)
RBC: 3.63 MIL/uL — ABNORMAL LOW (ref 3.87–5.11)
RDW: 11.7 % (ref 11.5–15.5)
WBC: 7.6 10*3/uL (ref 4.0–10.5)
nRBC: 0 % (ref 0.0–0.2)

## 2021-05-29 LAB — URINALYSIS, ROUTINE W REFLEX MICROSCOPIC
Bilirubin Urine: NEGATIVE
Glucose, UA: NEGATIVE mg/dL
Hgb urine dipstick: NEGATIVE
Ketones, ur: NEGATIVE mg/dL
Nitrite: NEGATIVE
Protein, ur: NEGATIVE mg/dL
Specific Gravity, Urine: 1.026 (ref 1.005–1.030)
pH: 6 (ref 5.0–8.0)

## 2021-05-29 LAB — LIPASE, BLOOD: Lipase: 26 U/L (ref 11–51)

## 2021-05-29 LAB — COMPREHENSIVE METABOLIC PANEL
ALT: 11 U/L (ref 0–44)
AST: 15 U/L (ref 15–41)
Albumin: 3.3 g/dL — ABNORMAL LOW (ref 3.5–5.0)
Alkaline Phosphatase: 56 U/L (ref 38–126)
Anion gap: 6 (ref 5–15)
BUN: 13 mg/dL (ref 6–20)
CO2: 25 mmol/L (ref 22–32)
Calcium: 8.8 mg/dL — ABNORMAL LOW (ref 8.9–10.3)
Chloride: 106 mmol/L (ref 98–111)
Creatinine, Ser: 0.64 mg/dL (ref 0.44–1.00)
GFR, Estimated: >60 mL/min (ref >60)
Glucose, Bld: 93 mg/dL (ref 70–99)
Potassium: 3.7 mmol/L (ref 3.5–5.1)
Sodium: 137 mmol/L (ref 135–145)
Total Bilirubin: 0.5 mg/dL (ref 0.3–1.2)
Total Protein: 6.7 g/dL (ref 6.5–8.1)

## 2021-05-29 LAB — I-STAT BETA HCG BLOOD, ED (MC, WL, AP ONLY): I-stat hCG, quantitative: <5 m[IU]/mL (ref <5)

## 2021-05-29 LAB — TROPONIN I (HIGH SENSITIVITY): Troponin I (High Sensitivity): <2 ng/L (ref <18)

## 2021-05-29 LAB — D-DIMER, QUANTITATIVE: D-Dimer, Quant: 5.2 ug/mL-FEU — ABNORMAL HIGH (ref 0.00–0.50)

## 2021-05-29 NOTE — ED Notes (Signed)
Called for in lobby, not there. Called and left a message on cell phone after no answer. Will call in lobby again in ten minutes before removing from floor.

## 2021-05-29 NOTE — ED Provider Triage Note (Signed)
Emergency Medicine Provider Triage Evaluation Note  Sonya Dunn , a 21 y.o. female  was evaluated in triage.  Pt complains of gradual onset, constant, sharp, right upper quadrant/right rib pain for the past 2 to 3 days.  No known injury.  Pain worse with deep inspiration, movement, to the touch.  She has not tried anything for the pain.  She denies any nausea, vomiting, diarrhea, fevers, chills.  No history of DVT or PE.  No recent prolonged travel or immobilization.  No hemoptysis.  No active malignancy.  No exogenous hormone use.  Review of Systems  Positive: + rib pain/RUQ pain, SOB Negative: - nausea, vomiting, fevers, chills, diarrhea  Physical Exam  BP 123/86    Pulse 78    Temp 98.2 F (36.8 C) (Oral)    Resp 20    Ht 5\' 2"  (1.575 m)    Wt 67.1 kg    LMP 05/19/2021    SpO2 100%    BMI 27.07 kg/m  Gen:   Awake, no distress   Resp:  Normal effort  MSK:   Moves extremities without difficulty  Other:  + RUQ and right anterior rib TTP  Medical Decision Making  Medically screening exam initiated at 12:07 AM.  Appropriate orders placed.  ZAPHIRA WEIMAN was informed that the remainder of the evaluation will be completed by another provider, this initial triage assessment does not replace that evaluation, and the importance of remaining in the ED until their evaluation is complete.     Eustaquio Maize, PA-C 05/29/21 0008

## 2021-05-29 NOTE — ED Triage Notes (Signed)
Pt presents to the ED with complaints of right rib pain onset 2-3 days ago. Pt denies injury. Pain is worse with palpation. Denies N/V. Worse with deep breaths

## 2021-05-29 NOTE — ED Notes (Signed)
Attempted to contact patient, phone went straight to vm, no answer in the lobby.  Will remove off the floor.

## 2021-05-29 NOTE — ED Notes (Signed)
Pt told registration they were LWBS

## 2021-05-30 ENCOUNTER — Ambulatory Visit (HOSPITAL_COMMUNITY)
Admission: RE | Admit: 2021-05-30 | Discharge: 2021-05-30 | Disposition: A | Discharge status: Home / Self Care | Payer: Medicaid Other | Source: Ambulatory Visit | Attending: Family Medicine | Admitting: Family Medicine

## 2021-05-30 ENCOUNTER — Encounter (HOSPITAL_COMMUNITY): Payer: Self-pay

## 2021-05-30 VITALS — BP 95/65 | HR 82 | Temp 98.7°F | Resp 15

## 2021-05-30 DIAGNOSIS — R110 Nausea: Secondary | ICD-10-CM

## 2021-05-30 DIAGNOSIS — R1011 Right upper quadrant pain: Secondary | ICD-10-CM

## 2021-05-30 NOTE — Discharge Instructions (Addendum)
You were seen today for right upper quadrant abdominal pain.  This could be muscular or could be an internal issue, like your liver or gallbladder.  Your work up yesterday in the ER looked normal.  I recommend you follow up with your primary care provider to discuss possible ultrasound.  I recommend you use a heating pad, and use tylenol for pain.

## 2021-05-30 NOTE — ED Provider Notes (Signed)
[facility]    CSN: [id number] Arrival date & time: 05/30/21  0854      History   Chief Complaint Chief Complaint  Patient presents with   appt 9am    Back Pain   Chest Pain    HPI Sonya Dunn is a 21 y.o. female.   Started 4 days with pain at the right upper abdomen/rib area.  Started as intermittent, but now is constant, about 8/10;  sharp when laying down, sitting up is a dull ache;   may radiate into the back with movement;   No otc meds taken;  no heat/ice;  No fevers/chills;   + nausea off/on;  no vomiting;  Normal Bms.  No urination issues;  Normal appetite.   Eating/filling up her stomach makes it hurt.  She was seen in the ER yesterday;  work up negative thus far;    Past Medical History:  Diagnosis Date   Geographical tongue    Headache    Seasonal allergies     Patient Active Problem List   Diagnosis Date Noted   History of pre-eclampsia 12/09/2018    Past Surgical History:  Procedure Laterality Date   EYE SURGERY      OB History     Gravida  1   Para  1   Term  1   Preterm      AB      Living  1      SAB      IAB      Ectopic      Multiple  0   Live Births  1            Home Medications    Prior to Admission medications   Medication Sig Start Date End Date Taking? Authorizing Provider  norelgestromin-ethinyl estradiol [facility]) 150-35 MCG/24HR transdermal patch Place 1 patch onto the skin once a week. Patient not taking: Reported on 04/09/2021 01/13/21   Milas Hock, MD  oseltamivir (TAMIFLU) 75 MG capsule Take 1 capsule (75 mg total) by mouth every 12 (twelve) hours. 03/29/21   Particia Nearing, PA-C  promethazine-dextromethorphan (PROMETHAZINE-DM) 6.25-15 MG/5ML syrup Take 5 mLs by mouth 4 (four) times daily as needed for cough. 03/29/21   Particia Nearing, PA-C    Family History Family History  Problem Relation Age of Onset   Healthy Mother    Healthy Father    Hypertension  Paternal Grandfather     Social History Social History   Tobacco Use   Smoking status: Never   Smokeless tobacco: Never  Vaping Use   Vaping Use: Never used  Substance Use Topics   Alcohol use: Never   Drug use: Not Currently    Types: Marijuana     Allergies   Other   Review of Systems Review of Systems  Constitutional: Negative.   HENT: Negative.    Cardiovascular:  Positive for chest pain.  Gastrointestinal:  Positive for abdominal pain and nausea. Negative for blood in stool, constipation, diarrhea and vomiting.  Genitourinary: Negative.   Musculoskeletal:  Positive for back pain.    Physical Exam Triage Vital Signs ED Triage Vitals  Enc Vitals Group     BP 05/30/21 0936 95/65     Pulse Rate 05/30/21 0936 82     Resp 05/30/21 0936 15     Temp 05/30/21 0936 98.7 F (37.1 C)     Temp Source 05/30/21 0936 Oral     SpO2 05/30/21 0936 98 %  Weight --      Height --      Head Circumference --      Peak Flow --      Pain Score 05/30/21 0934 8     Pain Loc --      Pain Edu? --      Excl. in GC? --    No data found.  Updated Vital Signs BP 95/65 (BP Location: Left Arm)    Pulse 82    Temp 98.7 F (37.1 C) (Oral)    Resp 15    LMP 05/19/2021    SpO2 98%   Visual Acuity Right Eye Distance:   Left Eye Distance:   Bilateral Distance:    Right Eye Near:   Left Eye Near:    Bilateral Near:     Physical Exam Constitutional:      Appearance: She is well-developed.  Cardiovascular:     Rate and Rhythm: Normal rate and regular rhythm.     Heart sounds: Normal heart sounds.  Pulmonary:     Effort: Pulmonary effort is normal.  Abdominal:     Palpations: Abdomen is soft.     Tenderness: There is abdominal tenderness.     Comments: RUQ tenderness;  no guarding or rebound  Musculoskeletal:        General: Normal range of motion.     Cervical back: Normal range of motion and neck supple.     Comments: No TTP at the anterior or posterior ribs   Neurological:     Mental Status: She is alert.     UC Treatments / Results  Labs (all labs ordered are listed, but only abnormal results are displayed) Labs Reviewed - No data to display  EKG   Radiology DG Ribs Unilateral W/Chest Right  Result Date: 05/29/2021 CLINICAL DATA:  Rib pain. EXAM: RIGHT RIBS AND CHEST - 3+ VIEW COMPARISON:  06/10/2006. FINDINGS: No fracture or other bone lesions are seen involving the ribs. There is no evidence of pneumothorax or pleural effusion. Both lungs are clear. Heart size and mediastinal contours are within normal limits. IMPRESSION: Negative. Electronically Signed   By: Sonya Dunn M.D.   On: 05/29/2021 00:52    Procedures Procedures (including critical care time)  Medications Ordered in UC Medications - No data to display  Initial Impression / Assessment and Plan / UC Course  I have reviewed the triage vital signs and the nursing notes.  Pertinent labs & imaging results that were available during my care of the patient were reviewed by me and considered in my medical decision making (see chart for details).  Patient seen for ruq abd pain after work up in the ER yesterday;  No worse today.  Question muscular vs biliary in nature;  recommend follow up with her pcp for possible ultrasound.    Final Clinical Impressions(s) / UC Diagnoses   Final diagnoses:  RUQ abdominal pain  Nausea without vomiting     Discharge Instructions      You were seen today for right upper quadrant abdominal pain.  This could be muscular or could be an internal issue, like your liver or gallbladder.  Your work up yesterday in the ER looked normal.  I recommend you follow up with your primary care provider to discuss possible ultrasound.  I recommend you use a heating pad, and use tylenol for pain.     ED Prescriptions   None    PDMP not reviewed this encounter.   Honour Schwieger,  Denny PeonErin, MD 05/30/21 1009

## 2021-05-30 NOTE — ED Triage Notes (Signed)
Pt reports that since MOnday had pains in right rib area and her back. Denies injury, vomiting or coughing.

## 2021-06-12 ENCOUNTER — Ambulatory Visit: Payer: Medicaid Other

## 2021-07-08 ENCOUNTER — Ambulatory Visit: Payer: Medicaid Other | Admitting: Family Medicine

## 2021-09-12 ENCOUNTER — Ambulatory Visit: Payer: Medicaid Other | Admitting: Nurse Practitioner

## 2021-09-13 ENCOUNTER — Emergency Department (HOSPITAL_COMMUNITY): Payer: Medicaid Other

## 2021-09-13 ENCOUNTER — Encounter (HOSPITAL_COMMUNITY): Payer: Self-pay | Admitting: Emergency Medicine

## 2021-09-13 ENCOUNTER — Other Ambulatory Visit: Payer: Self-pay

## 2021-09-13 ENCOUNTER — Emergency Department (HOSPITAL_COMMUNITY)
Admission: EM | Admit: 2021-09-13 | Discharge: 2021-09-13 | Disposition: A | Discharge status: Home / Self Care | Payer: Medicaid Other | Attending: Emergency Medicine | Admitting: Emergency Medicine

## 2021-09-13 DIAGNOSIS — S20311A Abrasion of right front wall of thorax, initial encounter: Secondary | ICD-10-CM | POA: Diagnosis not present

## 2021-09-13 DIAGNOSIS — S0990XA Unspecified injury of head, initial encounter: Secondary | ICD-10-CM | POA: Diagnosis present

## 2021-09-13 DIAGNOSIS — S46911A Strain of unspecified muscle, fascia and tendon at shoulder and upper arm level, right arm, initial encounter: Secondary | ICD-10-CM | POA: Insufficient documentation

## 2021-09-13 DIAGNOSIS — Y92410 Unspecified street and highway as the place of occurrence of the external cause: Secondary | ICD-10-CM | POA: Insufficient documentation

## 2021-09-13 DIAGNOSIS — S060X0A Concussion without loss of consciousness, initial encounter: Secondary | ICD-10-CM

## 2021-09-13 DIAGNOSIS — S46811A Strain of other muscles, fascia and tendons at shoulder and upper arm level, right arm, initial encounter: Secondary | ICD-10-CM

## 2021-09-13 MED ORDER — IBUPROFEN 400 MG PO TABS
600.0000 mg | ORAL_TABLET | Freq: Once | ORAL | Status: AC
  Administered 2021-09-13: 600 mg via ORAL
  Filled 2021-09-13: qty 1

## 2021-09-13 NOTE — Discharge Instructions (Addendum)
If you develop new or worsening headache, vision changes, speech changes, dizziness or being off balance, nausea or vomiting, weakness or numbness, or any other new/concerning symptoms then return to the ER for evaluation. ?
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
## 2021-09-19 ENCOUNTER — Ambulatory Visit: Payer: Medicaid Other | Admitting: Nurse Practitioner

## 2021-12-04 ENCOUNTER — Ambulatory Visit: Payer: Medicaid Other | Admitting: Certified Nurse Midwife

## 2021-12-04 NOTE — Progress Notes (Signed)
No show to appt.

## 2022-01-20 ENCOUNTER — Ambulatory Visit: Payer: Medicaid Other | Admitting: Obstetrics and Gynecology

## 2022-02-24 ENCOUNTER — Ambulatory Visit (INDEPENDENT_AMBULATORY_CARE_PROVIDER_SITE_OTHER): Payer: Medicaid Other | Admitting: General Practice

## 2022-02-24 VITALS — BP 115/77 | HR 94 | Ht 62.0 in | Wt 161.2 lb

## 2022-02-24 DIAGNOSIS — Z3201 Encounter for pregnancy test, result positive: Secondary | ICD-10-CM

## 2022-02-24 DIAGNOSIS — Z3200 Encounter for pregnancy test, result unknown: Secondary | ICD-10-CM

## 2022-02-24 LAB — POCT URINE PREGNANCY: Preg Test, Ur: POSITIVE — AB

## 2022-02-24 NOTE — Progress Notes (Signed)
Sonya Dunn presents today for UPT. She has no unusual complaints. LMP: 12-29-21    OBJECTIVE: Appears well, in no apparent distress.  OB History     Gravida  1   Para  1   Term  1   Preterm      AB      Living  1      SAB      IAB      Ectopic      Multiple  0   Live Births  1          Home UPT Result: Positive x5 In-Office UPT result: Positive  I have reviewed the patient's medical, obstetrical, social, and family histories, and medications.   ASSESSMENT: Positive pregnancy test  PLAN Prenatal care to be completed at: [facility]

## 2022-03-03 ENCOUNTER — Telehealth: Payer: Self-pay | Admitting: Emergency Medicine

## 2022-03-03 ENCOUNTER — Encounter (HOSPITAL_COMMUNITY): Payer: Self-pay | Admitting: Obstetrics & Gynecology

## 2022-03-03 ENCOUNTER — Inpatient Hospital Stay (HOSPITAL_COMMUNITY): Payer: Medicaid Other

## 2022-03-03 ENCOUNTER — Inpatient Hospital Stay (HOSPITAL_COMMUNITY)
Admission: AD | Admit: 2022-03-03 | Discharge: 2022-03-03 | Disposition: A | Discharge status: Home / Self Care | Payer: Medicaid Other | Attending: Obstetrics & Gynecology | Admitting: Obstetrics & Gynecology

## 2022-03-03 DIAGNOSIS — Z3490 Encounter for supervision of normal pregnancy, unspecified, unspecified trimester: Secondary | ICD-10-CM

## 2022-03-03 DIAGNOSIS — O209 Hemorrhage in early pregnancy, unspecified: Secondary | ICD-10-CM | POA: Diagnosis present

## 2022-03-03 LAB — URINALYSIS, ROUTINE W REFLEX MICROSCOPIC
Bilirubin Urine: NEGATIVE
Glucose, UA: NEGATIVE mg/dL
Ketones, ur: NEGATIVE mg/dL
Nitrite: NEGATIVE
Protein, ur: NEGATIVE mg/dL
Specific Gravity, Urine: 1.024 (ref 1.005–1.030)
WBC, UA: >50 WBC/hpf — ABNORMAL HIGH (ref 0–5)
pH: 7 (ref 5.0–8.0)

## 2022-03-03 LAB — CBC
HCT: 36.1 % (ref 36.0–46.0)
Hemoglobin: 12.3 g/dL (ref 12.0–15.0)
MCH: 29.6 pg (ref 26.0–34.0)
MCHC: 34.1 g/dL (ref 30.0–36.0)
MCV: 87 fL (ref 80.0–100.0)
Platelets: 236 10*3/uL (ref 150–400)
RBC: 4.15 MIL/uL (ref 3.87–5.11)
RDW: 12.2 % (ref 11.5–15.5)
WBC: 9.6 10*3/uL (ref 4.0–10.5)
nRBC: 0 % (ref 0.0–0.2)

## 2022-03-03 LAB — WET PREP, GENITAL
Sperm: NONE SEEN
Trich, Wet Prep: NONE SEEN
WBC, Wet Prep HPF POC: >=10 — AB
Yeast Wet Prep HPF POC: NONE SEEN

## 2022-03-03 LAB — HCG, QUANTITATIVE, PREGNANCY: hCG, Beta Chain, Quant, S: 5976 m[IU]/mL — ABNORMAL HIGH (ref <5)

## 2022-03-03 MED ORDER — PREPLUS 27-1 MG PO TABS: 1.0000 | ORAL_TABLET | Freq: Every day | ORAL | 13 refills | Status: DC

## 2022-03-03 NOTE — MAU Provider Note (Signed)
History     CSN: [id number]  Arrival date and time: 03/03/22 1656   Event Date/Time   First Provider Initiated Contact with Patient 03/03/22 1809      Chief Complaint  Patient presents with   Vaginal Bleeding   HPI Sonya Dunn is a 21 y.o. [id number] at [redacted]w[redacted]d by certain LMP.  She presents to MAU with chief complaint of vaginal spotting. This is a new problem, onset yesterday. Bleeding is scant to small and "looks like small red pepper flakes". She denies pain. She denies dysuria, constipation, back pain, fever or recent illness. She is remote from sexual intercourse.  OB History     Gravida  2   Para  1   Term  1   Preterm      AB      Living  1      SAB      IAB      Ectopic      Multiple  0   Live Births  1           Past Medical History:  Diagnosis Date   Geographical tongue    Headache    Seasonal allergies     Past Surgical History:  Procedure Laterality Date   EYE SURGERY      Family History  Problem Relation Age of Onset   Healthy Mother    Healthy Father    Hypertension Paternal Grandfather     Social History   Tobacco Use   Smoking status: Never   Smokeless tobacco: Never  Vaping Use   Vaping Use: Never used  Substance Use Topics   Alcohol use: Never   Drug use: Not Currently    Types: Marijuana    Allergies:  Allergies  Allergen Reactions   Other     Seasonal    Medications Prior to Admission  Medication Sig Dispense Refill Last Dose   norelgestromin-ethinyl estradiol Marilu Favre) 150-35 MCG/24HR transdermal patch Place 1 patch onto the skin once a week. (Patient not taking: Reported on 04/09/2021) 12 patch 4    oseltamivir (TAMIFLU) 75 MG capsule Take 1 capsule (75 mg total) by mouth every 12 (twelve) hours. (Patient not taking: Reported on 02/24/2022) 10 capsule 0    promethazine-dextromethorphan (PROMETHAZINE-DM) 6.25-15 MG/5ML syrup Take 5 mLs by mouth 4 (four) times daily as needed for cough. (Patient not  taking: Reported on 02/24/2022) 100 mL 0     Review of Systems  Genitourinary:  Positive for vaginal bleeding.  All other systems reviewed and are negative.  Physical Exam   Blood pressure 106/65, pulse 89, temperature 98.5 F (36.9 C), temperature source Oral, resp. rate 15, last menstrual period 12/29/2021, SpO2 100 %.  Physical Exam Vitals and nursing note reviewed. Exam conducted with a chaperone present.  Constitutional:      Appearance: Normal appearance.  Cardiovascular:     Rate and Rhythm: Normal rate.     Pulses: Normal pulses.  Pulmonary:     Effort: Pulmonary effort is normal.  Abdominal:     General: Abdomen is flat.  Skin:    Capillary Refill: Capillary refill takes less than 2 seconds.  Neurological:     Mental Status: She is alert and oriented to person, place, and time.  Psychiatric:        Mood and Affect: Mood normal.        Behavior: Behavior normal.        Thought Content: Thought content normal.  Judgment: Judgment normal.     MAU Course  Procedures  MDM  Orders Placed This Encounter  Procedures   Wet prep, genital   US OB LESS THAN 14 WEEKS WITH OB TRANSVAGINAL   US OB Transvaginal   CBC   hCG, quantitative, pregnancy   Urinalysis, Routine w reflex microscopic Urine, Clean Catch   Nursing communication   Discharge patient   Patient Vitals for the past 24 hrs:  BP Temp Temp src Pulse Resp SpO2  03/03/22 1823 102/72 -- -- 80 -- --  03/03/22 1717 106/65 98.5 F (36.9 C) Oral 89 15 100 %   Results for orders placed or performed during the hospital encounter of 03/03/22 (from the past 24 hour(s))  CBC     Status: None   Collection Time: 03/03/22  5:15 PM  Result Value Ref Range   WBC 9.6 4.0 - 10.5 K/uL   RBC 4.15 3.87 - 5.11 MIL/uL   Hemoglobin 12.3 12.0 - 15.0 g/dL   HCT 67.8 93.8 - 10.1 %   MCV 87.0 80.0 - 100.0 fL   MCH 29.6 26.0 - 34.0 pg   MCHC 34.1 30.0 - 36.0 g/dL   RDW 75.1 02.5 - 85.2 %   Platelets 236 150 - 400  K/uL   nRBC 0.0 0.0 - 0.2 %  hCG, quantitative, pregnancy     Status: Abnormal   Collection Time: 03/03/22  5:15 PM  Result Value Ref Range   hCG, Beta Chain, Quant, S 5,976 (H) <5 mIU/mL  Wet prep, genital     Status: Abnormal   Collection Time: 03/03/22  5:21 PM   Specimen: PATH Cytology Cervicovaginal Ancillary Only  Result Value Ref Range   Yeast Wet Prep HPF POC NONE SEEN NONE SEEN   Trich, Wet Prep NONE SEEN NONE SEEN   Clue Cells Wet Prep HPF POC PRESENT (A) NONE SEEN   WBC, Wet Prep HPF POC >=10 (A) <10   Sperm NONE SEEN   Urinalysis, Routine w reflex microscopic Urine, Clean Catch     Status: Abnormal   Collection Time: 03/03/22  5:21 PM  Result Value Ref Range   Color, Urine YELLOW YELLOW   APPearance CLOUDY (A) CLEAR   Specific Gravity, Urine 1.024 1.005 - 1.030   pH 7.0 5.0 - 8.0   Glucose, UA NEGATIVE NEGATIVE mg/dL   Hgb urine dipstick MODERATE (A) NEGATIVE   Bilirubin Urine NEGATIVE NEGATIVE   Ketones, ur NEGATIVE NEGATIVE mg/dL   Protein, ur NEGATIVE NEGATIVE mg/dL   Nitrite NEGATIVE NEGATIVE   Leukocytes,Ua LARGE (A) NEGATIVE   RBC / HPF 6-10 0 - 5 RBC/hpf   WBC, UA >50 (H) 0 - 5 WBC/hpf   Bacteria, UA MANY (A) NONE SEEN   Squamous Epithelial / LPF 0-5 0 - 5   Mucus PRESENT    US OB LESS THAN 14 WEEKS WITH OB TRANSVAGINAL  Result Date: 03/03/2022 CLINICAL DATA:  Vaginal bleeding in early pregnancy. EXAM: OBSTETRIC <14 WK [place] AND TRANSVAGINAL OB US TECHNIQUE: Both transabdominal and transvaginal ultrasound examinations were performed for complete evaluation of the gestation as well as the maternal uterus, adnexal regions, and pelvic cul-de-sac. Transvaginal technique was performed to assess early pregnancy. COMPARISON:  None Available. FINDINGS: Intrauterine gestational sac: Single Yolk sac:  Visualized. Embryo:  Not Visualized. Cardiac Activity: Not Visualized. Heart Rate: N/A  bpm MSD: 7.4 mm   5 w   3 d Subchorionic hemorrhage:  None visualized. Maternal  uterus/adnexae: Right ovary: Appears normal  containing small corpus luteal cyst Left ovary: Normal Other :None Free fluid:  None IMPRESSION: Probable early intrauterine gestational sac containing a yolk sac No fetal pole, or cardiac activity yet visualized. Recommend follow-up quantitative B-HCG levels and follow-up US in 14 days to confirm and assess viability. This recommendation follows SRU consensus guidelines: Diagnostic Criteria for Nonviable Pregnancy Early in the First Trimester. Malva Limes Med 2013; 975:8832-54. Electronically Signed   By: Signa Kell M.D.   On: 03/03/2022 18:05     Assessment and Plan  --21 y.o. G2P1001 with confirmed IUP --+ GS, + YS on formal ultrasound --Hgb 12.3 --Blood type O POS, Rhogam no indicated --Discharge home in stable condition  F/U: --Order placed for repeat viability scan at [facility] in about two weeks  Calvert Cantor, MSA, MSN, CNM 03/03/2022, 7:37 PM

## 2022-03-03 NOTE — Telephone Encounter (Signed)
Incoming call from pt  who is [redacted] weeks pregnant who reports "wine" colored vaginal bleeding that began this morning.  Denies abdominal or pelvic pain.  Recommended to present to MAU for evaluation. Patient agreed and verbalized understanding.

## 2022-03-03 NOTE — Telephone Encounter (Signed)
Attempted call x3 to patient who called office with c/o vaginal bleeding at 9 weeks.

## 2022-03-03 NOTE — MAU Note (Signed)
...  Sonya Dunn is a 21 y.o. at [redacted]w[redacted]d here in MAU reporting: Yesterday morning she began experiencing light pink spotting that she noted on her tissue when wiping after using the restroom but reports she thought it was coming from her rectum. She reports around 0730 this morning she went to the restroom and saw dark red blood in the toilet with "pepper flake" sized blood clots. She reports since that episode she has been experiencing light pink vaginal spotting all day. Denies recent IC. Denies pain. Denies vaginal itching and vaginal odors.  First appointment 10/16.  LMP: 12/29/2021 Onset of complaint: Yesterday Pain score: Denies pain.  Lab orders placed from triage: UA

## 2022-03-03 NOTE — Discharge Instructions (Signed)

## 2022-03-04 LAB — GC/CHLAMYDIA PROBE AMP (~~LOC~~) NOT AT ARMC
Chlamydia: POSITIVE — AB
Comment: NEGATIVE
Comment: NORMAL
Neisseria Gonorrhea: NEGATIVE

## 2022-03-05 ENCOUNTER — Telehealth (HOSPITAL_COMMUNITY): Payer: Self-pay

## 2022-03-05 MED ORDER — AZITHROMYCIN 500 MG PO TABS: 1000.0000 mg | ORAL_TABLET | Freq: Once | ORAL | 0 refills | Status: AC

## 2022-03-09 ENCOUNTER — Ambulatory Visit: Payer: Medicaid Other | Admitting: *Deleted

## 2022-03-09 ENCOUNTER — Ambulatory Visit (INDEPENDENT_AMBULATORY_CARE_PROVIDER_SITE_OTHER): Payer: Medicaid Other

## 2022-03-09 VITALS — BP 118/77 | HR 90 | Ht 62.0 in | Wt 165.9 lb

## 2022-03-09 DIAGNOSIS — Z3480 Encounter for supervision of other normal pregnancy, unspecified trimester: Secondary | ICD-10-CM

## 2022-03-09 DIAGNOSIS — O3680X0 Pregnancy with inconclusive fetal viability, not applicable or unspecified: Secondary | ICD-10-CM

## 2022-03-09 NOTE — Progress Notes (Signed)
New OB Intake  I connected with  Sonya Dunn on 03/09/22 at  2:10 PM EDT by In Person Visit and verified that I am speaking with the correct person using two identifiers. Nurse is located at [facility] and pt is located at [place].  I discussed the limitations, risks, security and privacy concerns of performing an evaluation and management service by telephone and the availability of in person appointments. I also discussed with the patient that there may be a patient responsible charge related to this service. The patient expressed understanding and agreed to proceed.  I explained I am completing New OB Intake today. We discussed her EDD of 10/05/22 that is based on LMP of 12/29/21. Pt is G2/P1. I reviewed her allergies, medications, Medical/Surgical/OB history, and appropriate screenings. I informed her of [facility] services. [facility] information placed in AVS. Based on history, this is a/an  pregnancy uncomplicated .   Patient Active Problem List   Diagnosis Date Noted   History of pre-eclampsia 12/09/2018    Concerns addressed today  Delivery Plans Plans to deliver at [facility]. Patient given information for [facility] Healthy Baby website for more information about Women's and Celebr[facility]. Patient is interested in water birth. Offered upcoming OB visit with CNM to discuss further.  MyChart/Babyscripts MyChart access verified. I explained pt will have some visits in office and some virtually. Babyscripts instructions given and order placed. Patient verifies receipt of registration text/e-mail. Account successfully created and app downloaded.  Blood Pressure Cuff/Weight Scale Blood pressure cuff ordered for patient to pick-up from [employer]. Explained after first prenatal appt pt will check weekly and document in 37. Patient does / does not  have weight scale. Weight scale ordered for patient to pick up from [employer].   Anatomy US Explained first scheduled [place] will be around  19 weeks. Anatomy US scheduled for 19 wks at [facility]. Pt notified to arrive at [facility].  Labs Discussed Johnsie Cancel genetic screening with patient. Would like both Panorama and Horizon drawn at new OB visit. Routine prenatal labs needed.  Covid Vaccine Patient has covid vaccine.   Social Determinants of Health Food Insecurity: Patient denies food insecurity. WIC Referral: Patient is interested in referral to [facility].  Transportation: Patient denies transportation needs. Childcare: Discussed no children allowed at ultrasound appointments. Offered childcare services; patient declines childcare services at this time.  First visit review I reviewed new OB appt with pt. I explained she will have a provider visit that includes labs and pelvic exam. Explained pt will be seen by Dr. Elgie Congo at first visit; encounter routed to appropriate provider. Explained that patient will be seen by pregnancy navigator following visit with provider.   Penny Pia, RN 03/09/2022  2:58 PM

## 2022-03-09 NOTE — Progress Notes (Signed)
Patient was assessed and managed by nursing staff during this encounter. I have reviewed the chart and agree with the documentation and plan. I have also made any necessary editorial changes.  Griffin Basil, MD 03/09/2022 8:15 PM

## 2022-03-13 ENCOUNTER — Ambulatory Visit (HOSPITAL_COMMUNITY)
Admission: EM | Admit: 2022-03-13 | Discharge: 2022-03-13 | Disposition: A | Discharge status: Home / Self Care | Payer: Medicaid Other

## 2022-03-24 ENCOUNTER — Other Ambulatory Visit: Payer: Self-pay | Admitting: *Deleted

## 2022-03-24 DIAGNOSIS — O219 Vomiting of pregnancy, unspecified: Secondary | ICD-10-CM

## 2022-03-24 MED ORDER — PROMETHAZINE HCL 25 MG PO TABS: 25.0000 mg | ORAL_TABLET | Freq: Four times a day (QID) | ORAL | 1 refills | Status: DC | PRN

## 2022-03-24 NOTE — Progress Notes (Signed)
TC from pt reporting nausea and vomiting in early pregnancy. RX phenergan per protocol. Advised to seek care in MAU if unable to keep down med and then food and fluids. Pt verbalized understanding.

## 2022-04-02 ENCOUNTER — Ambulatory Visit: Payer: Medicaid Other | Admitting: Family Medicine

## 2022-04-05 ENCOUNTER — Other Ambulatory Visit: Payer: Self-pay

## 2022-04-05 ENCOUNTER — Inpatient Hospital Stay (HOSPITAL_COMMUNITY)
Admission: AD | Admit: 2022-04-05 | Discharge: 2022-04-05 | Disposition: A | Discharge status: Home / Self Care | Payer: Medicaid Other | Attending: Obstetrics and Gynecology | Admitting: Obstetrics and Gynecology

## 2022-04-05 ENCOUNTER — Other Ambulatory Visit: Payer: Self-pay | Admitting: Advanced Practice Midwife

## 2022-04-05 DIAGNOSIS — O99611 Diseases of the digestive system complicating pregnancy, first trimester: Secondary | ICD-10-CM | POA: Insufficient documentation

## 2022-04-05 DIAGNOSIS — K117 Disturbances of salivary secretion: Secondary | ICD-10-CM | POA: Insufficient documentation

## 2022-04-05 DIAGNOSIS — O26891 Other specified pregnancy related conditions, first trimester: Secondary | ICD-10-CM | POA: Diagnosis present

## 2022-04-05 DIAGNOSIS — Z3A13 13 weeks gestation of pregnancy: Secondary | ICD-10-CM | POA: Insufficient documentation

## 2022-04-05 DIAGNOSIS — R1012 Left upper quadrant pain: Secondary | ICD-10-CM | POA: Diagnosis not present

## 2022-04-05 DIAGNOSIS — O219 Vomiting of pregnancy, unspecified: Secondary | ICD-10-CM | POA: Insufficient documentation

## 2022-04-05 DIAGNOSIS — K219 Gastro-esophageal reflux disease without esophagitis: Secondary | ICD-10-CM | POA: Insufficient documentation

## 2022-04-05 DIAGNOSIS — Z3480 Encounter for supervision of other normal pregnancy, unspecified trimester: Secondary | ICD-10-CM

## 2022-04-05 DIAGNOSIS — R042 Hemoptysis: Secondary | ICD-10-CM | POA: Insufficient documentation

## 2022-04-05 LAB — URINALYSIS, ROUTINE W REFLEX MICROSCOPIC
Bacteria, UA: NONE SEEN
Bilirubin Urine: NEGATIVE
Glucose, UA: NEGATIVE mg/dL
Hgb urine dipstick: NEGATIVE
Ketones, ur: NEGATIVE mg/dL
Nitrite: NEGATIVE
Protein, ur: 30 mg/dL — AB
Specific Gravity, Urine: 1.026 (ref 1.005–1.030)
pH: 6 (ref 5.0–8.0)

## 2022-04-05 MED ORDER — GLYCOPYRROLATE 1 MG PO TABS
2.0000 mg | ORAL_TABLET | Freq: Once | ORAL | Status: AC
  Administered 2022-04-05: 2 mg via ORAL
  Filled 2022-04-05: qty 2

## 2022-04-05 MED ORDER — ONDANSETRON HCL 8 MG PO TABS: 4.0000 mg | ORAL_TABLET | Freq: Three times a day (TID) | ORAL | 3 refills | Status: DC | PRN

## 2022-04-05 MED ORDER — FAMOTIDINE 20 MG PO TABS
20.0000 mg | ORAL_TABLET | Freq: Once | ORAL | Status: AC
  Administered 2022-04-05: 20 mg via ORAL
  Filled 2022-04-05: qty 1

## 2022-04-05 MED ORDER — ONDANSETRON 4 MG PO TBDP
8.0000 mg | ORAL_TABLET | Freq: Once | ORAL | Status: AC
  Administered 2022-04-05: 8 mg via ORAL
  Filled 2022-04-05: qty 2

## 2022-04-05 MED ORDER — ONDANSETRON 8 MG PO TBDP: 8.0000 mg | ORAL_TABLET | Freq: Three times a day (TID) | ORAL | 2 refills | Status: DC | PRN

## 2022-04-05 MED ORDER — GLYCOPYRROLATE 1 MG PO TABS: 1.0000 mg | ORAL_TABLET | Freq: Three times a day (TID) | ORAL | 2 refills | Status: DC

## 2022-04-05 MED ORDER — FAMOTIDINE 20 MG PO TABS: 20.0000 mg | ORAL_TABLET | Freq: Two times a day (BID) | ORAL | 3 refills | Status: AC

## 2022-04-05 NOTE — MAU Note (Signed)
Sonya Dunn is a 21 y.o. at [redacted]w[redacted]d here in MAU reporting: for the last 2 days has been having LUQ pain and increased vomiting. Last phenergan was last night. 4 episodes of emesis. Reports she has been able to keep down fluids.  Onset of complaint: ongoing  Pain score: 4/10  Vitals:   04/05/22 1349  BP: 121/76  Pulse: 92  Resp: 16  Temp: 98.3 F (36.8 C)  SpO2: 98%     FHT:177  Lab orders placed from triage: UA

## 2022-04-05 NOTE — MAU Provider Note (Signed)
Chief Complaint: Abdominal Pain and Nausea   Event Date/Time   First Provider Initiated Contact with Patient 04/05/22 1511     SUBJECTIVE HPI: Sonya Dunn is a 21 y.o. G2P1001 at [redacted]w[redacted]d who presents to Maternity Admissions reporting N/V and seeing streaks of blood. Taking Phenergan without improvement and doesn't like the drowsiness. Has used Zofran in the past and prefers it.   Associated signs and symptoms: Pos for excessive saliva which aggravates  N/V and LUQ pain w/ vomiting.   Past Medical History:  Diagnosis Date   Geographical tongue    Headache    Seasonal allergies    OB History  Gravida Para Term Preterm AB Living  2 1 1     1   SAB IAB Ectopic Multiple Live Births        0 1    # Outcome Date GA Lbr Len/2nd Weight Sex Delivery Anes PTL Lv  2 Current           1 Term 11/07/18 [redacted]w[redacted]d 06:39 / 00:14 3116 g M Vag-Spont None  LIV   Past Surgical History:  Procedure Laterality Date   EYE SURGERY     Social History   Socioeconomic History   Marital status: Single    Spouse name: [place]   Number of children: Not on file   Years of education: Not on file   Highest education level: Not on file  Occupational History   Not on file  Tobacco Use   Smoking status: Never   Smokeless tobacco: Never  Vaping Use   Vaping Use: Never used  Substance and Sexual Activity   Alcohol use: Never   Drug use: Not Currently    Types: Marijuana   Sexual activity: Yes    Partners: Male    Birth control/protection: None  Other Topics Concern   Not on file  Social History Narrative   Not on file   Social Determinants of Health   Financial Resource Strain: Not on file  Food Insecurity: Not on file  Transportation Needs: Not on file  Physical Activity: Not on file  Stress: Not on file  Social Connections: Not on file  Intimate Partner Violence: Not on file   Family History  Problem Relation Age of Onset   Healthy Mother    Healthy Father    Hypertension Paternal  Grandfather    No current facility-administered medications on file prior to encounter.   Current Outpatient Medications on File Prior to Encounter  Medication Sig Dispense Refill   Prenatal Vit-Fe Fumarate-FA (PREPLUS) 27-1 MG TABS Take 1 tablet by mouth daily. 30 tablet 13   promethazine (PHENERGAN) 25 MG tablet Take 1 tablet (25 mg total) by mouth every 6 (six) hours as needed for nausea or vomiting. 30 tablet 1   Allergies  Allergen Reactions   Other     Seasonal    I have reviewed patient's Past Medical Hx, Surgical Hx, Family Hx, Social Hx, medications and allergies.   Review of Systems  Constitutional:  Negative for appetite change, chills and fever.  HENT:         Ptyalism  Gastrointestinal:  Positive for abdominal pain (Intermittent LUQ pain), nausea and vomiting. Negative for constipation and diarrhea.       Scant blood in vomit  Genitourinary:  Negative for vaginal bleeding.    OBJECTIVE Patient Vitals for the past 24 hrs:  BP Temp Temp src Pulse Resp SpO2 Height Weight  04/05/22 1349 121/76 98.3 F (36.8 C)  Oral 92 16 98 % -- --  04/05/22 1342 -- -- -- -- -- -- 5\' 2"  (1.575 m) 75.3 kg   Constitutional: Well-developed, well-nourished female in no acute distress.  Cardiovascular: normal rate Respiratory: normal rate and effort.  GI: Abd soft, non-tender, gravid appropriate for gestational age. Pos BS x 4 MS: Extremities nontender, no edema, normal ROM Neurologic: Alert and oriented x 4.  GU:   FHGR 177 by doppler  LAB RESULTS Results for orders placed or performed during the hospital encounter of 04/05/22 (from the past 24 hour(s))  Urinalysis, Routine w reflex microscopic Urine, Clean Catch     Status: Abnormal   Collection Time: 04/05/22  2:17 PM  Result Value Ref Range   Color, Urine YELLOW YELLOW   APPearance HAZY (A) CLEAR   Specific Gravity, Urine 1.026 1.005 - 1.030   pH 6.0 5.0 - 8.0   Glucose, UA NEGATIVE NEGATIVE mg/dL   Hgb urine dipstick  NEGATIVE NEGATIVE   Bilirubin Urine NEGATIVE NEGATIVE   Ketones, ur NEGATIVE NEGATIVE mg/dL   Protein, ur 30 (A) NEGATIVE mg/dL   Nitrite NEGATIVE NEGATIVE   Leukocytes,Ua TRACE (A) NEGATIVE   RBC / HPF 0-5 0 - 5 RBC/hpf   WBC, UA 0-5 0 - 5 WBC/hpf   Bacteria, UA NONE SEEN NONE SEEN   Squamous Epithelial / LPF 6-10 0 - 5   Mucus PRESENT    Hyaline Casts, UA PRESENT     IMAGING No results found.  MAU COURSE Orders Placed This Encounter  Procedures   Urinalysis, Routine w reflex microscopic Urine, Clean Catch   Discharge patient   Meds ordered this encounter  Medications   ondansetron (ZOFRAN-ODT) disintegrating tablet 8 mg   famotidine (PEPCID) tablet 20 mg   glycopyrrolate (ROBINUL) tablet 2 mg   ondansetron (ZOFRAN) 8 MG tablet    Sig: Take 0.5 tablets (4 mg total) by mouth every 8 (eight) hours as needed for nausea or vomiting.    Dispense:  30 tablet    Refill:  3    Order Specific Question:   Supervising Provider    Answer:   13/12/23 A [[id number]]   glycopyrrolate (ROBINUL) 1 MG tablet    Sig: Take 1-2 tablets (1-2 mg total) by mouth 3 (three) times daily.    Dispense:  90 tablet    Refill:  2    Order Specific Question:   Supervising Provider    Answer:   Mariel Aloe A [[id number]]   famotidine (PEPCID) 20 MG tablet    Sig: Take 1 tablet (20 mg total) by mouth 2 (two) times daily.    Dispense:  60 tablet    Refill:  3    Order Specific Question:   Supervising Provider    Answer:   Mariel Aloe [[id number]]    MDM - N/V inadequately controlled w/ phenergan. Has not developed scant hemoptysis. No evidence of emergent condition. Changes to Zofran. Tolerating PO's. Will add Robinul for ptyalism and Pepcid for reflux Sx.   ASSESSMENT 1. Supervision of other normal pregnancy, antepartum   2. Nausea/vomiting in pregnancy   3. Hemoptysis   4. Ptyalism   5. Gastroesophageal reflux during pregnancy in first trimester, antepartum   6. [redacted] weeks gestation of  pregnancy     PLAN Discharge home in stable condition. Hyperemesis precautions  Follow-up Information     [facility] Follow up on 04/07/2022.   Why: New OB visit as scheduled Contact information: [address] [place] [address] [phone]        [facility] Follow up.   Specialty: Obstetrics and Gynecology Why: As needed if symptoms worsen Contact information: [address] [id number] mc [place] [address] [phone]               Allergies as of 04/05/2022       Reactions   Other    Seasonal        Medication List     STOP taking these medications    promethazine 25 MG tablet Commonly known as: PHENERGAN       TAKE these medications    famotidine 20 MG tablet Commonly known as: PEPCID Take 1 tablet (20 mg total) by mouth 2 (two) times daily.   glycopyrrolate 1 MG tablet Commonly known as: ROBINUL Take 1-2 tablets (1-2 mg total) by mouth 3 (three) times daily.   ondansetron 8 MG tablet Commonly known as: ZOFRAN Take 0.5 tablets (4 mg total) by mouth every 8 (eight) hours as needed for nausea or vomiting.   PrePLUS 27-1 MG Tabs Take 1 tablet by mouth daily.         Katrinka Blazing, [place], CNM 04/05/2022  4:15 PM

## 2022-04-05 NOTE — Discharge Instructions (Signed)
  [facility]eringPregnancy is a model of prenatal care that started 30 years ago and is used in about 600 practices around the [place]. You meet with a group of 8-12 women due around the same time as you. In [facility]ering you will have individual time with the provider and meet as a group. There's much more time for discussion and learning. You will actually have much more time with your provider in Centering than in traditional prenatal care.? You will come directly into the [facility] room and will not wait in the lobby so there is no wasted time. You will have 2-hour visits every 4 weeks then every 2 weeks. You will know your Centering prenatal appointments in advance. In your last month of pregnancy, you may also come in for some individual visits. Additional appointments can be scheduled if you need more care. Studies have shown that CenteringPregnancy improves birth outcomes. We have seen especially big improvements in fewer Black women delivering babies who are too small or born too early. Visit the website [facility] for more information. Let your provider or clinic staff know if you want to sign up.    

## 2022-04-07 ENCOUNTER — Ambulatory Visit (INDEPENDENT_AMBULATORY_CARE_PROVIDER_SITE_OTHER): Payer: Medicaid Other | Admitting: Obstetrics and Gynecology

## 2022-04-07 ENCOUNTER — Institutional Professional Consult (permissible substitution): Payer: Medicaid Other | Admitting: Licensed Clinical Social Worker

## 2022-04-07 ENCOUNTER — Encounter: Payer: Self-pay | Admitting: Obstetrics and Gynecology

## 2022-04-07 ENCOUNTER — Other Ambulatory Visit (HOSPITAL_COMMUNITY)
Admission: RE | Admit: 2022-04-07 | Discharge: 2022-04-07 | Disposition: A | Discharge status: Home / Self Care | Payer: Medicaid Other | Source: Ambulatory Visit | Attending: Obstetrics and Gynecology | Admitting: Obstetrics and Gynecology

## 2022-04-07 VITALS — BP 104/72 | HR 96 | Wt 165.2 lb

## 2022-04-07 DIAGNOSIS — Z3A10 10 weeks gestation of pregnancy: Secondary | ICD-10-CM

## 2022-04-07 DIAGNOSIS — Z3481 Encounter for supervision of other normal pregnancy, first trimester: Secondary | ICD-10-CM

## 2022-04-07 DIAGNOSIS — Z3480 Encounter for supervision of other normal pregnancy, unspecified trimester: Secondary | ICD-10-CM | POA: Insufficient documentation

## 2022-04-07 DIAGNOSIS — Z8759 Personal history of other complications of pregnancy, childbirth and the puerperium: Secondary | ICD-10-CM

## 2022-04-07 MED ORDER — ASPIRIN 81 MG PO CHEW: 81.0000 mg | CHEWABLE_TABLET | Freq: Every day | ORAL | 6 refills | Status: DC

## 2022-04-07 NOTE — Progress Notes (Deleted)
INITIAL PRENATAL VISIT NOTE  Subjective:  Sonya Dunn is a 21 y.o. G2P1001 at [redacted]w[redacted]d by ***LMP being seen today for her initial prenatal visit. This is a ***planned pregnancy.  She was using *** for birth control previously. She has an obstetric history significant for ***. She has a medical history significant for ***.  Patient reports {sx:14538}.  Contractions: Not present. Vag. Bleeding: None.   . Denies leaking of fluid.    Past Medical History:  Diagnosis Date   Geographical tongue    Headache    Seasonal allergies     Past Surgical History:  Procedure Laterality Date   EYE SURGERY      OB History  Gravida Para Term Preterm AB Living  2 1 1     1   SAB IAB Ectopic Multiple Live Births        0 1    # Outcome Date GA Lbr Len/2nd Weight Sex Delivery Anes PTL Lv  2 Current           1 Term 11/07/18 [redacted]w[redacted]d 06:39 / 00:14 6 lb 13.9 oz (3.116 kg) M Vag-Spont None  LIV    Social History   Socioeconomic History   Marital status: Single    Spouse name: [place]   Number of children: Not on file   Years of education: Not on file   Highest education level: Not on file  Occupational History   Not on file  Tobacco Use   Smoking status: Never   Smokeless tobacco: Never  Vaping Use   Vaping Use: Never used  Substance and Sexual Activity   Alcohol use: Never   Drug use: Not Currently    Types: Marijuana   Sexual activity: Yes    Partners: Male    Birth control/protection: None  Other Topics Concern   Not on file  Social History Narrative   Not on file   Social Determinants of Health   Financial Resource Strain: Not on file  Food Insecurity: Not on file  Transportation Needs: Not on file  Physical Activity: Not on file  Stress: Not on file  Social Connections: Not on file    Family History  Problem Relation Age of Onset   Healthy Mother    Healthy Father    Hypertension Paternal Grandfather      Current Outpatient Medications:    aspirin 81 MG  chewable tablet, Chew 1 tablet (81 mg total) by mouth daily., Disp: 30 tablet, Rfl: 6   famotidine (PEPCID) 20 MG tablet, Take 1 tablet (20 mg total) by mouth 2 (two) times daily., Disp: 60 tablet, Rfl: 3   glycopyrrolate (ROBINUL) 1 MG tablet, Take 1-2 tablets (1-2 mg total) by mouth 3 (three) times daily., Disp: 90 tablet, Rfl: 2   ondansetron (ZOFRAN-ODT) 8 MG disintegrating tablet, Take 1 tablet (8 mg total) by mouth every 8 (eight) hours as needed for nausea or vomiting., Disp: 60 tablet, Rfl: 2   Prenatal Vit-Fe Fumarate-FA (PREPLUS) 27-1 MG TABS, Take 1 tablet by mouth daily., Disp: 30 tablet, Rfl: 13  Allergies  Allergen Reactions   Other     Seasonal    Review of Systems: Negative except for what is mentioned in HPI.  Objective:   Vitals:   04/07/22 1324  BP: 104/72  Pulse: 96  Weight: 165 lb 3.2 oz (74.9 kg)    Fetal Status:           Physical Exam: BP 104/72   Pulse 96  Wt 165 lb 3.2 oz (74.9 kg)   LMP 12/29/2021   BMI 30.22 kg/m  CONSTITUTIONAL: Well-developed, well-nourished female in ***no acute distress.  NEUROLOGIC: Alert and oriented to person, place, and time. Normal reflexes, muscle tone coordination. No cranial nerve deficit noted. PSYCHIATRIC: Normal mood and affect. Normal behavior. Normal judgment and thought content. SKIN: Skin is warm and dry. No rash noted. Not diaphoretic. No erythema. No pallor. HENT:  Normocephalic, atraumatic, External right and left ear normal. Oropharynx is clear and moist EYES: Conjunctivae and EOM are normal. Pupils are equal, round, and reactive to light. No scleral icterus.  NECK: Normal range of motion, supple, no masses CARDIOVASCULAR: Normal heart rate noted, regular rhythm RESPIRATORY: Effort and breath sounds normal, no problems with respiration noted BREASTS: symmetric, ***non-tender, no masses palpable ABDOMEN: Soft, ***nontender, ***nondistended, gravid. GU: ***normal appearing external female genitalia,  ***multiparous***nulliparous ***normal appearing cervix, scant white discharge in vagina, no lesions noted Bimanual: *** weeks sized uterus, no adnexal tenderness or palpable lesions noted MUSCULOSKELETAL: Normal range of motion. EXT:  No edema and no tenderness. 2+ distal pulses.   Assessment and Plan:  Pregnancy: G2P1001 at [redacted]w[redacted]d by ***LMP  1. [redacted] weeks gestation of pregnancy *** - CBC/D/Plt+RPR+Rh+ABO+RubIgG...  2. Supervision of other normal pregnancy, antepartum *** - Culture, OB Urine - Cervicovaginal ancillary only( [place]) - Cytology - PAP( [place]) - Panorama Prenatal Test Full Panel - [facility] Custom - [place] MFM OB COMP + 14 WK; Future - CBC/D/Plt+RPR+Rh+ABO+RubIgG...  3. History of pre-eclampsia *** - Comprehensive metabolic panel - Protein / creatinine ratio, urine - aspirin 81 MG chewable tablet; Chew 1 tablet (81 mg total) by mouth daily.  Dispense: 30 tablet; Refill: 6   {Blank single:19197::"Term","Preterm"} labor symptoms and general obstetric precautions including but not limited to vaginal bleeding, contractions, leaking of fluid and fetal movement were reviewed in detail with the patient.  Please refer to After Visit Summary for other counseling recommendations.   Return in about 4 weeks (around 05/05/2022) for ROB, in person.  Warden Fillers 04/07/2022 1:52 PM

## 2022-04-07 NOTE — Progress Notes (Signed)
Patient presents for New OB. Patient has no concerns today.  °

## 2022-04-07 NOTE — Patient Instructions (Addendum)
For colds and allergies  Any anti-histamine including benadryl, allegra, claritin, etc.  Sudafed but not phenylephrine  Mucinex  Robitussin  For Reflux/heartburn  Pepcid Zantac Tums Prilosec Prevacid  For yeast infections  Monistat  For constipation  Colace  For minor aches and pains  Tylenol-do not take more than 4000mg  in 24 hours. Therma-care or like heat packs      Considering Waterbirth? Guide for patients at [facility]) Why consider waterbirth? Gentle birth for babies  Less pain medicine used in labor  May allow for passive descent/less pushing  May reduce perineal tears  More mobility and instinctive maternal position changes  Increased maternal relaxation   Is waterbirth safe? What are the risks of infection, drowning or other complications? Infection:  Very low risk (3.7 % for tub vs 4.8% for bed)  7 in 8000 waterbirths with documented infection  Poorly cleaned equipment most common cause  Slightly lower group B strep transmission rate  Drowning  Maternal:  Very low risk  Related to seizures or fainting  Newborn:  Very low risk. No evidence of increased risk of respiratory problems in multiple large studies  Physiological protection from breathing under water  Avoid underwater birth if there are any fetal complications  Once baby's head is out of the water, keep it out.  Birth complication  Some reports of cord trauma, but risk decreased by bringing baby to surface gradually  No evidence of increased risk of shoulder dystocia. Mothers can usually change positions faster in water than in a bed, possibly aiding the maneuvers to free the shoulder.   There are 2 things you MUST do to have a waterbirth with [facility]: Attend a waterbirth class at [facility] at [facility]   3rd Wednesday of every month from 7-9 pm (virtual during COVID) Saturday at [email or web address] or [employer] or  by calling [phone] Bring [phone] the certificate from the class to your prenatal appointment or send via MyChart Meet with a midwife at 36 weeks* to see if you can still plan a waterbirth and to sign the consent.   *We also recommend that you schedule as many of your prenatal visits with a midwife as possible.    Helpful information: You may want to bring a bathing suit top to the hospital to wear during labor but this is optional.  All other supplies are provided by the hospital. Please arrive at the hospital with signs of active labor, and do not wait at home until late in labor. It takes 45 min- 1 hour for fetal monitoring, and check in to your room to take place, plus transport and filling of the waterbirth tub.    Things that would prevent you from having a waterbirth: Premature, <37wks  Previous cesarean birth  Presence of thick meconium-stained fluid  Multiple gestation (Twins, triplets, etc.)  Uncontrolled diabetes or gestational diabetes requiring medication  Hypertension diagnosed in pregnancy or preexisting hypertension (gestational hypertension, preeclampsia, or chronic hypertension) Fetal growth restriction (your baby measures less than 10th percentile on ultrasound) Heavy vaginal bleeding  Non-reassuring fetal heart rate  Active infection (MRSA, etc.). Group B Strep is NOT a contraindication for waterbirth.  If your labor has to be induced and induction method requires continuous monitoring of the baby's heart rate  Other risks/issues identified by your obstetrical provider   Please remember that birth is unpredictable. Under certain unforeseeable circumstances your provider may advise against giving birth in the tub. These decisions will  be made on a case-by-case basis and with the safety of you and your baby as our highest priority.    Updated 08/27/21    Contraindications to Waterbirth at [facility]:   History of c-section  Preterm birth less than 37 weeks  Thick,  particulate meconium-stained fluid  Maternal fever over 101 degrees Fahrenheit  Heavy bleeding or signs of placental abruption  Pre-eclampsia, Chronic hypertension or Gestational Hypertension  Any abnormal fetal heart rate pattern  If epidural analgesia is utilized during [employer] Executive [employer]  Multiple gestation pregnancy  Active communicable disease   Significant limitation to mobility  Preexisting Diabetes, A2 Gestational diabetes or Uncontrolled A1 Gestational diabetes  Any other indication based on medical provider discretion  Special Considerations: Some maternal conditions that may become contraindications by provider discretion are history of seizure or syncope, especially without documentation of management or resolution.  The patient will be required to leave the birthing tub if there is a situation warranting a more complete  fetal assessment, continuous fetal monitoring and/or the necessity for the infant to be delivered outside  the tub.

## 2022-04-07 NOTE — Progress Notes (Signed)
INITIAL PRENATAL VISIT NOTE  Subjective:  Sonya Dunn is a 21 y.o. G2P1001 at [redacted]w[redacted]d by early ultrasound being seen today for her initial prenatal visit. She has an obstetric history significant for preeclampsia and vaginal delivery. She has an uncomplicated medical history.  Patient reports no complaints.  Contractions: Not present. Vag. Bleeding: None.   . Denies leaking of fluid.    Past Medical History:  Diagnosis Date   Geographical tongue    Headache    Seasonal allergies     Past Surgical History:  Procedure Laterality Date   EYE SURGERY      OB History  Gravida Para Term Preterm AB Living  2 1 1     1   SAB IAB Ectopic Multiple Live Births        0 1    # Outcome Date GA Lbr Len/2nd Weight Sex Delivery Anes PTL Lv  2 Current           1 Term 11/07/18 [redacted]w[redacted]d 06:39 / 00:14 6 lb 13.9 oz (3.116 kg) M Vag-Spont None  LIV    Social History   Socioeconomic History   Marital status: Single    Spouse name: [place]   Number of children: Not on file   Years of education: Not on file   Highest education level: Not on file  Occupational History   Not on file  Tobacco Use   Smoking status: Never   Smokeless tobacco: Never  Vaping Use   Vaping Use: Never used  Substance and Sexual Activity   Alcohol use: Never   Drug use: Not Currently    Types: Marijuana   Sexual activity: Yes    Partners: Male    Birth control/protection: None  Other Topics Concern   Not on file  Social History Narrative   Not on file   Social Determinants of Health   Financial Resource Strain: Not on file  Food Insecurity: Not on file  Transportation Needs: Not on file  Physical Activity: Not on file  Stress: Not on file  Social Connections: Not on file    Family History  Problem Relation Age of Onset   Healthy Mother    Healthy Father    Hypertension Paternal Grandfather      Current Outpatient Medications:    aspirin 81 MG chewable tablet, Chew 1 tablet (81 mg total)  by mouth daily., Disp: 30 tablet, Rfl: 6   famotidine (PEPCID) 20 MG tablet, Take 1 tablet (20 mg total) by mouth 2 (two) times daily., Disp: 60 tablet, Rfl: 3   glycopyrrolate (ROBINUL) 1 MG tablet, Take 1-2 tablets (1-2 mg total) by mouth 3 (three) times daily., Disp: 90 tablet, Rfl: 2   ondansetron (ZOFRAN-ODT) 8 MG disintegrating tablet, Take 1 tablet (8 mg total) by mouth every 8 (eight) hours as needed for nausea or vomiting., Disp: 60 tablet, Rfl: 2   Prenatal Vit-Fe Fumarate-FA (PREPLUS) 27-1 MG TABS, Take 1 tablet by mouth daily., Disp: 30 tablet, Rfl: 13  Allergies  Allergen Reactions   Other     Seasonal    Review of Systems: Negative except for what is mentioned in HPI.  Objective:   Vitals:   04/07/22 1324  BP: 104/72  Pulse: 96  Weight: 165 lb 3.2 oz (74.9 kg)    Fetal Status: Fetal Heart Rate (bpm): 177         Physical Exam: BP 104/72   Pulse 96   Wt 165 lb 3.2 oz (74.9 kg)  LMP 12/29/2021   BMI 30.22 kg/m  CONSTITUTIONAL: Well-developed, well-nourished female in no acute distress.  NEUROLOGIC: Alert and oriented to person, place, and time. Normal reflexes, muscle tone coordination. No cranial nerve deficit noted. PSYCHIATRIC: Normal mood and affect. Normal behavior. Normal judgment and thought content. SKIN: Skin is warm and dry. No rash noted. Not diaphoretic. No erythema. No pallor. HENT:  Normocephalic, atraumatic, External right and left ear normal. Oropharynx is clear and moist EYES: Conjunctivae and EOM are normal.  NECK: Normal range of motion, supple, no masses CARDIOVASCULAR: Normal heart rate noted, regular rhythm RESPIRATORY: Effort and breath sounds normal, no problems with respiration noted BREASTS: deferred ABDOMEN: Soft, nontender, nondistended, gravid. GU: normal appearing external female genitalia, multiparous normal appearing cervix, scant white discharge in vagina, no lesions noted Bimanual: 11 weeks sized uterus, no adnexal tenderness  or palpable lesions noted MUSCULOSKELETAL: Normal range of motion. EXT:  No edema and no tenderness. 2+ distal pulses.   Assessment and Plan:  Pregnancy: G2P1001 at [redacted]w[redacted]d by early ultrasound  1. [redacted] weeks gestation of pregnancy  - CBC/D/Plt+RPR+Rh+ABO+RubIgG...  2. Supervision of other normal pregnancy, antepartum Continue routine prenatal - Culture, OB Urine - Cervicovaginal ancillary only( [place]) - Cytology - PAP( [place]) - Panorama Prenatal Test Full Panel - [facility] Custom - CBC/D/Plt+RPR+Rh+ABO+RubIgG... - [place] MFM OB COMP + 14 WK; Future  3. History of pre-eclampsia Start baby ASA at 14 weeks Baseline preeclampsia labs added  - Comprehensive metabolic panel - Protein / creatinine ratio, urine - aspirin 81 MG chewable tablet; Chew 1 tablet (81 mg total) by mouth daily.  Dispense: 30 tablet; Refill: 6   Preterm labor symptoms and general obstetric precautions including but not limited to vaginal bleeding, contractions, leaking of fluid and fetal movement were reviewed in detail with the patient.  Please refer to After Visit Summary for other counseling recommendations.   Return in about 4 weeks (around 05/05/2022) for ROB, in person.  Warden Fillers 04/07/2022 2:00 PM

## 2022-04-08 LAB — COMPREHENSIVE METABOLIC PANEL
ALT: 13 IU/L (ref 0–32)
AST: 18 IU/L (ref 0–40)
Albumin/Globulin Ratio: 1.4 (ref 1.2–2.2)
Albumin: 4.5 g/dL (ref 4.0–5.0)
Alkaline Phosphatase: 58 IU/L (ref 44–121)
BUN/Creatinine Ratio: 17 (ref 9–23)
BUN: 9 mg/dL (ref 6–20)
Bilirubin Total: 0.3 mg/dL (ref 0.0–1.2)
CO2: 21 mmol/L (ref 20–29)
Calcium: 9.2 mg/dL (ref 8.7–10.2)
Chloride: 101 mmol/L (ref 96–106)
Creatinine, Ser: 0.54 mg/dL — ABNORMAL LOW (ref 0.57–1.00)
Globulin, Total: 3.2 g/dL (ref 1.5–4.5)
Glucose: 80 mg/dL (ref 70–99)
Potassium: 3.7 mmol/L (ref 3.5–5.2)
Sodium: 138 mmol/L (ref 134–144)
Total Protein: 7.7 g/dL (ref 6.0–8.5)
eGFR: 134 mL/min/{1.73_m2} (ref >59)

## 2022-04-08 LAB — PROTEIN / CREATININE RATIO, URINE
Creatinine, Urine: 360.8 mg/dL
Protein, Ur: 32.1 mg/dL
Protein/Creat Ratio: 89 mg/g creat (ref 0–200)

## 2022-04-08 LAB — CBC/D/PLT+RPR+RH+ABO+RUBIGG...
Antibody Screen: NEGATIVE
Basophils Absolute: 0 10*3/uL (ref 0.0–0.2)
Basos: 0 %
EOS (ABSOLUTE): 0.1 10*3/uL (ref 0.0–0.4)
Eos: 1 %
HCV Ab: NONREACTIVE
HIV Screen 4th Generation wRfx: NONREACTIVE
Hematocrit: 36.6 % (ref 34.0–46.6)
Hemoglobin: 12.5 g/dL (ref 11.1–15.9)
Hepatitis B Surface Ag: NEGATIVE
Immature Grans (Abs): 0 10*3/uL (ref 0.0–0.1)
Immature Granulocytes: 0 %
Lymphocytes Absolute: 2.7 10*3/uL (ref 0.7–3.1)
Lymphs: 23 %
MCH: 29.9 pg (ref 26.6–33.0)
MCHC: 34.2 g/dL (ref 31.5–35.7)
MCV: 88 fL (ref 79–97)
Monocytes Absolute: 0.6 10*3/uL (ref 0.1–0.9)
Monocytes: 5 %
Neutrophils Absolute: 8.5 10*3/uL — ABNORMAL HIGH (ref 1.4–7.0)
Neutrophils: 71 %
Platelets: 232 10*3/uL (ref 150–450)
RBC: 4.18 x10E6/uL (ref 3.77–5.28)
RDW: 13 % (ref 11.7–15.4)
RPR Ser Ql: NONREACTIVE
Rh Factor: POSITIVE
Rubella Antibodies, IGG: 4.96 index (ref >0.99)
WBC: 11.9 10*3/uL — ABNORMAL HIGH (ref 3.4–10.8)

## 2022-04-08 LAB — CERVICOVAGINAL ANCILLARY ONLY
Chlamydia: NEGATIVE
Comment: NEGATIVE
Comment: NEGATIVE
Comment: NORMAL
Neisseria Gonorrhea: NEGATIVE
Trichomonas: NEGATIVE

## 2022-04-08 LAB — HCV INTERPRETATION

## 2022-04-09 LAB — URINE CULTURE, OB REFLEX

## 2022-04-09 LAB — CULTURE, OB URINE

## 2022-04-12 LAB — PANORAMA PRENATAL TEST FULL PANEL:PANORAMA TEST PLUS 5 ADDITIONAL MICRODELETIONS: FETAL FRACTION: 8.4

## 2022-04-20 LAB — HORIZON CUSTOM: REPORT SUMMARY: POSITIVE — AB

## 2022-04-21 ENCOUNTER — Encounter: Payer: Self-pay | Admitting: Obstetrics and Gynecology

## 2022-04-21 DIAGNOSIS — D563 Thalassemia minor: Secondary | ICD-10-CM | POA: Insufficient documentation

## 2022-05-06 ENCOUNTER — Ambulatory Visit (INDEPENDENT_AMBULATORY_CARE_PROVIDER_SITE_OTHER): Payer: Medicaid Other | Admitting: Licensed Clinical Social Worker

## 2022-05-06 ENCOUNTER — Ambulatory Visit (INDEPENDENT_AMBULATORY_CARE_PROVIDER_SITE_OTHER): Payer: Medicaid Other | Admitting: Obstetrics and Gynecology

## 2022-05-06 ENCOUNTER — Encounter: Payer: Self-pay | Admitting: Obstetrics and Gynecology

## 2022-05-06 VITALS — BP 104/71 | HR 93 | Wt 174.0 lb

## 2022-05-06 DIAGNOSIS — D563 Thalassemia minor: Secondary | ICD-10-CM

## 2022-05-06 DIAGNOSIS — Z8759 Personal history of other complications of pregnancy, childbirth and the puerperium: Secondary | ICD-10-CM

## 2022-05-06 DIAGNOSIS — Z3A14 14 weeks gestation of pregnancy: Secondary | ICD-10-CM

## 2022-05-06 DIAGNOSIS — Z3480 Encounter for supervision of other normal pregnancy, unspecified trimester: Secondary | ICD-10-CM

## 2022-05-06 NOTE — Progress Notes (Signed)
Subjective:  Sonya Dunn is a 21 y.o. G2P1001 at 35w3dbeing seen today for ongoing prenatal care.  She is currently monitored for the following issues for this high-risk pregnancy and has History of pre-eclampsia; Supervision of other normal pregnancy, antepartum; and Alpha thalassemia silent carrier on their problem list.  Patient reports  HA and spitting .  Contractions: Not present. Vag. Bleeding: None.   . Denies leaking of fluid.   The following portions of the patient's history were reviewed and updated as appropriate: allergies, current medications, past family history, past medical history, past social history, past surgical history and problem list. Problem list updated.  Objective:   Vitals:   05/06/22 1347  BP: 104/71  Pulse: 93  Weight: 174 lb (78.9 kg)    Fetal Status: Fetal Heart Rate (bpm): 160         General:  Alert, oriented and cooperative. Patient is in no acute distress.  Skin: Skin is warm and dry. No rash noted.   Cardiovascular: Normal heart rate noted  Respiratory: Normal respiratory effort, no problems with respiration noted  Abdomen: Soft, gravid, appropriate for gestational age. Pain/Pressure: Present     Pelvic:  Cervical exam deferred        Extremities: Normal range of motion.     Mental Status: Normal mood and affect. Normal behavior. Normal judgment and thought content.   Urinalysis:      Assessment and Plan:  Pregnancy: G2P1001 at 122w3d1. Supervision of other normal pregnancy, antepartum Stable Robinol did not here OTC Excedrin for HA  2. History of pre-eclampsia Stable Continue with qd BASA  3. Alpha thalassemia silent carrier Partner provided testing kit  Preterm labor symptoms and general obstetric precautions including but not limited to vaginal bleeding, contractions, leaking of fluid and fetal movement were reviewed in detail with the patient. Please refer to After Visit Summary for other counseling recommendations.   Return for OB visit, face to face, any provider.   ErChancy MilroyMD

## 2022-05-06 NOTE — Progress Notes (Signed)
Pt states she is having increase in Migraines.   Has hx migraines before pregnancy.

## 2022-05-06 NOTE — BH Specialist Note (Signed)
Integrated Behavioral Health Initial In-Person Visit  MRN: [id number] Name: OVETA IDRIS  Number of Integrated Behavioral Health Clinician visits: 1 Session Start time:   2:08pm Session End time: 2:23pm Total time in minutes: 15 mins in person at [facility]   Types of Service: General Behavioral Integrated Care (BHI)  Interpretor:No. Interpretor Name and Language: none   Warm Hand Off Completed.        Subjective: Sonya Dunn is a 21 y.o. female accompanied by Partner/Significant Other Patient was referred by Dr. Alysia Penna for new ob intro . Patient reports the following symptoms/concerns: no concerns  Duration of problem: n/a; Severity of problem: na  Objective: Mood: good  and Affect: Appropriate Risk of harm to self or others: No plan to harm self or others  Life Context: Family and Social: Lives in [place]  School/Work: n/a Self-Care: n/a Life Changes: new pregnancy  Patient and/or Family's Strengths/Protective Factors: Concrete supports in place (healthy food, safe environments, etc.)  Goals Addressed: Patient will: Prioritize rest  Keep Ob and all medical appts  Communicate needs  Gwyndolyn Saxon, LCSW

## 2022-05-06 NOTE — Patient Instructions (Addendum)
Second Trimester of Pregnancy  The second trimester of pregnancy is from week 13 through week 27. This is months 4 through 6 of pregnancy. The second trimester is often a time when you feel your best. Your body has adjusted to being pregnant, and you begin to feel better physically. During the second trimester: Morning sickness has lessened or stopped completely. You may have more energy. You may have an increase in appetite. The second trimester is also a time when the unborn baby (fetus) is growing rapidly. At the end of the sixth month, the fetus may be up to 12 inches long and weigh about 1 pounds. You will likely begin to feel the baby move (quickening) between 16 and 20 weeks of pregnancy. Body changes during your second trimester Your body continues to go through many changes during your second trimester. The changes vary and generally return to normal after the baby is born. Physical changes Your weight will continue to increase. You will notice your lower abdomen bulging out. You may begin to get stretch marks on your hips, abdomen, and breasts. Your breasts will continue to grow and to become tender. Dark spots or blotches (chloasma or mask of pregnancy) may develop on your face. A dark line from your belly button to the pubic area (linea nigra) may appear. You may have changes in your hair. These can include thickening of your hair, rapid growth, and changes in texture. Some people also have hair loss during or after pregnancy, or hair that feels dry or thin. Health changes You may develop headaches. You may have heartburn. You may develop constipation. You may develop hemorrhoids or swollen, bulging veins (varicose veins). Your gums may bleed and may be sensitive to brushing and flossing. You may urinate more often because the fetus is pressing on your bladder. You may have back pain. This is caused by: Weight gain. Pregnancy hormones that are relaxing the joints in your  pelvis. A shift in weight and the muscles that support your balance. Follow these instructions at home: Medicines Follow your health care provider's instructions regarding medicine use. Specific medicines may be either safe or unsafe to take during pregnancy. Do not take any medicines unless approved by your health care provider. Take a prenatal vitamin that contains at least 600 micrograms (mcg) of folic acid. Eating and drinking Eat a healthy diet that includes fresh fruits and vegetables, whole grains, good sources of protein such as meat, eggs, or tofu, and low-fat dairy products. Avoid raw meat and unpasteurized juice, milk, and cheese. These carry germs that can harm you and your baby. You may need to take these actions to prevent or treat constipation: Drink enough fluid to keep your urine pale yellow. Eat foods that are high in fiber, such as beans, whole grains, and fresh fruits and vegetables. Limit foods that are high in fat and processed sugars, such as fried or sweet foods. Activity Exercise only as directed by your health care provider. Most people can continue their usual exercise routine during pregnancy. Try to exercise for 30 minutes at least 5 days a week. Stop exercising if you develop contractions in your uterus. Stop exercising if you develop pain or cramping in the lower abdomen or lower back. Avoid exercising if it is very hot or humid or if you are at a high altitude. Avoid heavy lifting. If you choose to, you may have sex unless your health care provider tells you not to. Relieving pain and discomfort Wear a supportive  bra to prevent discomfort from breast tenderness. Take warm sitz baths to soothe any pain or discomfort caused by hemorrhoids. Use hemorrhoid cream if your health care provider approves. Rest with your legs raised (elevated) if you have leg cramps or low back pain. If you develop varicose veins: Wear support hose as told by your health care  provider. Elevate your feet for 15 minutes, 3-4 times a day. Limit salt in your diet. Safety Wear your seat belt at all times when driving or riding in a car. Talk with your health care provider if someone is verbally or physically abusive to you. Lifestyle Do not use hot tubs, steam rooms, or saunas. Do not douche. Do not use tampons or scented sanitary pads. Avoid cat litter boxes and soil used by cats. These carry germs that can cause birth defects in the baby and possibly loss of the fetus by miscarriage or stillbirth. Do not use herbal remedies, alcohol, illegal drugs, or medicines that are not approved by your health care provider. Chemicals in these products can harm your baby. Do not use any products that contain nicotine or tobacco, such as cigarettes, e-cigarettes, and chewing tobacco. If you need help quitting, ask your health care provider. General instructions During a routine prenatal visit, your health care provider will do a physical exam and other tests. He or she will also discuss your overall health. Keep all follow-up visits. This is important. Ask your health care provider for a referral to a local prenatal education class. Ask for help if you have counseling or nutritional needs during pregnancy. Your health care provider can offer advice or refer you to specialists for help with various needs. Where to find more information American Pregnancy Association: americanpregnancy.org American College of Obstetricians and Gynecologists: acog.org/en/Womens%20Health/Pregnancy Office on Women's Health: womenshealth.gov/pregnancy Contact a health care provider if you have: A headache that does not go away when you take medicine. Vision changes or you see spots in front of your eyes. Mild pelvic cramps, pelvic pressure, or nagging pain in the abdominal area. Persistent nausea, vomiting, or diarrhea. A bad-smelling vaginal discharge or foul-smelling urine. Pain when you  urinate. Sudden or extreme swelling of your face, hands, ankles, feet, or legs. A fever. Get help right away if you: Have fluid leaking from your vagina. Have spotting or bleeding from your vagina. Have severe abdominal cramping or pain. Have difficulty breathing. Have chest pain. Have fainting spells. Have not felt your baby move for the time period told by your health care provider. Have new or increased pain, swelling, or redness in an arm or leg. Summary The second trimester of pregnancy is from week 13 through week 27 (months 4 through 6). Do not use herbal remedies, alcohol, illegal drugs, or medicines that are not approved by your health care provider. Chemicals in these products can harm your baby. Exercise only as directed by your health care provider. Most people can continue their usual exercise routine during pregnancy. Keep all follow-up visits. This is important. This information is not intended to replace advice given to you by your health care provider. Make sure you discuss any questions you have with your health care provider. Document Revised: 10/18/2019 Document Reviewed: 08/24/2019 Elsevier Patient Education  2023 Elsevier Inc.  Considering Waterbirth? Guide for patients at [facility] ([facility]) Why consider waterbirth? Gentle birth for babies  Less pain medicine used in labor  May allow for passive descent/less pushing  May reduce perineal tears  More mobility and instinctive   and instinctive maternal position changes  Increased maternal relaxation   Is waterbirth safe? What are the risks of infection, drowning or other complications? Infection:  Very low risk (3.7 % for tub vs 4.8% for bed)  7 in 8000 waterbirths with documented infection  Poorly cleaned equipment most common cause  Slightly lower group B strep transmission rate  Drowning  Maternal:  Very low risk  Related to seizures or fainting  Newborn:  Very low risk. No evidence of increased  risk of respiratory problems in multiple large studies  Physiological protection from breathing under water  Avoid underwater birth if there are any fetal complications  Once baby's head is out of the water, keep it out.  Birth complication  Some reports of cord trauma, but risk decreased by bringing baby to surface gradually  No evidence of increased risk of shoulder dystocia. Mothers can usually change positions faster in water than in a bed, possibly aiding the maneuvers to free the shoulder.   There are 2 things you MUST do to have a waterbirth with [facility]: Attend a waterbirth class at [facility] at [facility]   3rd Wednesday of every month from 7-9 pm (virtual during COVID) [employer] at [email or web address] or [email or web address] or by calling [phone] Bring [place] the certificate from the class to your prenatal appointment or send via MyChart Meet with a midwife at 36 weeks* to see if you can still plan a waterbirth and to sign the consent.   *We also recommend that you schedule as many of your prenatal visits with a midwife as possible.    Helpful information: You may want to bring a bathing suit top to the hospital to wear during labor but this is optional.  All other supplies are provided by the hospital. Please arrive at the hospital with signs of active labor, and do not wait at home until late in labor. It takes 45 min- 1 hour for fetal monitoring, and check in to your room to take place, plus transport and filling of the waterbirth tub.    Things that would prevent you from having a waterbirth: Premature, <37wks  Previous cesarean birth  Presence of thick meconium-stained fluid  Multiple gestation (Twins, triplets, etc.)  Uncontrolled diabetes or gestational diabetes requiring medication  Hypertension diagnosed in pregnancy or preexisting hypertension (gestational hypertension, preeclampsia, or chronic hypertension) Fetal growth restriction  (your baby measures less than 10th percentile on ultrasound) Heavy vaginal bleeding  Non-reassuring fetal heart rate  Active infection (MRSA, etc.). Group B Strep is NOT a contraindication for waterbirth.  If your labor has to be induced and induction method requires continuous monitoring of the baby's heart rate  Other risks/issues identified by your obstetrical provider   Please remember that birth is unpredictable. Under certain unforeseeable circumstances your provider may advise against giving birth in the tub. These decisions will be made on a case-by-case basis and with the safety of you and your baby as our highest priority.    Updated 08/27/21

## 2022-05-07 ENCOUNTER — Telehealth: Payer: Self-pay | Admitting: *Deleted

## 2022-05-07 NOTE — Telephone Encounter (Signed)
TC to [facility] Cyt[facility] lab to follow up on Pap results from 04/07/22. Results not visible in Epic-states "in process". Lab tech states results are "insufficient due to scant cellularity" and that test will need to be recollected.

## 2022-05-08 LAB — CYTOLOGY - PAP: Adequacy: ABNORMAL

## 2022-05-25 NOTE — L&D Delivery Note (Signed)
Sonya Dunn is a 22 y.o. female G2P1001 with IUP at [redacted]w[redacted]d admitted for IOL for elevated blood pressures at term.  She progressed with pitocin and AROM augmentation to complete and pushed 2 times to deliver.  Cord clamping delayed by several minutes then clamped by CNM and cut by FOB.    Delivery Note At 9:08 PM a viable female was delivered via Vaginal, Spontaneous (Presentation: Right Occiput Anterior).  APGAR: 8, 8; weight pending.   Placenta status: Spontaneous, Intact.  Cord: 3 vessels with the following complications: None.  Anesthesia: Epidural Episiotomy: None Lacerations: None Suture Repair:  n/a Est. Blood Loss (mL): 200  Mom to postpartum.  Baby to Couplet care / Skin to Skin.  Rolm Bookbinder CNM 10/26/2022, 9:29 PM

## 2022-06-03 ENCOUNTER — Ambulatory Visit (INDEPENDENT_AMBULATORY_CARE_PROVIDER_SITE_OTHER): Payer: Medicaid Other | Admitting: Obstetrics and Gynecology

## 2022-06-03 ENCOUNTER — Encounter: Payer: Self-pay | Admitting: Obstetrics and Gynecology

## 2022-06-03 VITALS — BP 111/69 | HR 88 | Wt 182.8 lb

## 2022-06-03 DIAGNOSIS — Z3A18 18 weeks gestation of pregnancy: Secondary | ICD-10-CM

## 2022-06-03 DIAGNOSIS — Z3480 Encounter for supervision of other normal pregnancy, unspecified trimester: Secondary | ICD-10-CM

## 2022-06-03 DIAGNOSIS — Z3482 Encounter for supervision of other normal pregnancy, second trimester: Secondary | ICD-10-CM

## 2022-06-03 DIAGNOSIS — O219 Vomiting of pregnancy, unspecified: Secondary | ICD-10-CM

## 2022-06-03 NOTE — Progress Notes (Signed)
Pt c/o nausea, vomiting and headaches when bending over and swelling in the feet when standing too long. Unsure about AFP today.

## 2022-06-04 ENCOUNTER — Other Ambulatory Visit: Payer: Self-pay | Admitting: *Deleted

## 2022-06-04 DIAGNOSIS — Z3480 Encounter for supervision of other normal pregnancy, unspecified trimester: Secondary | ICD-10-CM

## 2022-06-04 NOTE — Progress Notes (Signed)
Updated u/s order per MFM.

## 2022-06-05 NOTE — Progress Notes (Signed)
   LOW-RISK PREGNANCY OFFICE VISIT Patient name: Sonya Dunn MRN [id number]  Date of birth: 03/03/2001 Chief Complaint:   Routine Prenatal Visit  History of Present Illness:   Sonya Dunn is a 22 y.o. G7P1001 female at [redacted]w[redacted]d with an Estimated Date of Delivery: 11/01/22 being seen today for ongoing management of a low-risk pregnancy.  Today she reports nausea, vomiting with bending over and swelling in both hands and feet. Contractions: Not present. Vag. Bleeding: None.  Movement: Present. denies leaking of fluid. Review of Systems:   Pertinent items are noted in HPI Denies abnormal vaginal discharge w/ itching/odor/irritation, headaches, visual changes, shortness of breath, chest pain, abdominal pain, severe nausea/vomiting, or problems with urination or bowel movements unless otherwise stated above. Pertinent History Reviewed:  Reviewed past medical,surgical, social, obstetrical and family history.  Reviewed problem list, medications and allergies. Physical Assessment:   Vitals:   06/03/22 1411  BP: 111/69  Pulse: 88  Weight: 182 lb 12.8 oz (82.9 kg)  Body mass index is 33.43 kg/m.        Physical Examination:   General appearance: Well appearing, and in no distress  Mental status: Alert, oriented to person, place, and time  Skin: Warm & dry  Cardiovascular: Normal heart rate noted  Respiratory: Normal respiratory effort, no distress  Abdomen: Soft, gravid, nontender  Pelvic: Cervical exam deferred         Extremities: Edema: None  Fetal Status: Fetal Heart Rate (bpm): 146   Movement: Present  S=D  No results found for this or any previous visit (from the past 24 hour(s)).  Assessment & Plan:  1) Low-risk pregnancy G2P1001 at [redacted]w[redacted]d with an Estimated Date of Delivery: 11/01/22   2) Supervision of other normal pregnancy, antepartum - Not taking bASA -- "I have the Rx, but I wasn't sure of when to start taking them." - Advised should have started at 12 weeks and  needs to start taking them daily from now - Advised that mild swelling of feet and hands is a normal variation of pregnancy   3) Nausea and vomiting during pregnancy - Continue Robinul and Zofran as prescribed; has 2 refills at pharmacy on file - Explained that N/V could be aggravated by the bending over with an enlarging abdomen   Meds: No orders of the defined types were placed in this encounter.  Labs/procedures today: non  Plan:  Continue routine obstetrical care   Reviewed: Preterm labor symptoms and general obstetric precautions including but not limited to vaginal bleeding, contractions, leaking of fluid and fetal movement were reviewed in detail with the patient.  All questions were answered. Has home bp cuff. Check bp weekly, let us know if >140/90.   Follow-up: Return in about 4 weeks (around 07/01/2022) for Return OB visit.  No orders of the defined types were placed in this encounter.  Laury Deep MSN, CNM 06/03/2022 2:00 PM

## 2022-06-08 ENCOUNTER — Ambulatory Visit: Payer: Medicaid Other | Admitting: *Deleted

## 2022-06-08 ENCOUNTER — Ambulatory Visit: Payer: Medicaid Other | Attending: Obstetrics and Gynecology

## 2022-06-08 ENCOUNTER — Other Ambulatory Visit: Payer: Self-pay | Admitting: *Deleted

## 2022-06-08 ENCOUNTER — Encounter: Payer: Self-pay | Admitting: *Deleted

## 2022-06-08 VITALS — BP 108/66 | HR 97

## 2022-06-08 DIAGNOSIS — Z3480 Encounter for supervision of other normal pregnancy, unspecified trimester: Secondary | ICD-10-CM

## 2022-06-08 DIAGNOSIS — Z363 Encounter for antenatal screening for malformations: Secondary | ICD-10-CM | POA: Diagnosis not present

## 2022-06-08 DIAGNOSIS — D563 Thalassemia minor: Secondary | ICD-10-CM

## 2022-06-08 DIAGNOSIS — Z3A19 19 weeks gestation of pregnancy: Secondary | ICD-10-CM | POA: Diagnosis not present

## 2022-06-08 DIAGNOSIS — O99322 Drug use complicating pregnancy, second trimester: Secondary | ICD-10-CM

## 2022-06-08 DIAGNOSIS — O09292 Supervision of pregnancy with other poor reproductive or obstetric history, second trimester: Secondary | ICD-10-CM | POA: Insufficient documentation

## 2022-06-08 DIAGNOSIS — O99212 Obesity complicating pregnancy, second trimester: Secondary | ICD-10-CM | POA: Insufficient documentation

## 2022-06-08 DIAGNOSIS — O1492 Unspecified pre-eclampsia, second trimester: Secondary | ICD-10-CM | POA: Insufficient documentation

## 2022-06-08 DIAGNOSIS — O09293 Supervision of pregnancy with other poor reproductive or obstetric history, third trimester: Secondary | ICD-10-CM

## 2022-06-08 DIAGNOSIS — O99213 Obesity complicating pregnancy, third trimester: Secondary | ICD-10-CM

## 2022-07-15 ENCOUNTER — Encounter: Payer: Self-pay | Admitting: Obstetrics and Gynecology

## 2022-07-15 ENCOUNTER — Ambulatory Visit (INDEPENDENT_AMBULATORY_CARE_PROVIDER_SITE_OTHER): Payer: Medicaid Other | Admitting: Obstetrics and Gynecology

## 2022-07-15 VITALS — BP 118/77 | HR 96 | Wt 192.0 lb

## 2022-07-15 DIAGNOSIS — Z3480 Encounter for supervision of other normal pregnancy, unspecified trimester: Secondary | ICD-10-CM

## 2022-07-15 DIAGNOSIS — Z3482 Encounter for supervision of other normal pregnancy, second trimester: Secondary | ICD-10-CM

## 2022-07-15 DIAGNOSIS — Z3A24 24 weeks gestation of pregnancy: Secondary | ICD-10-CM

## 2022-07-15 DIAGNOSIS — Z8759 Personal history of other complications of pregnancy, childbirth and the puerperium: Secondary | ICD-10-CM

## 2022-07-15 MED ORDER — PHENAZOPYRIDINE HCL 200 MG PO TABS: 200.0000 mg | ORAL_TABLET | Freq: Three times a day (TID) | ORAL | 0 refills | Status: DC

## 2022-07-15 MED ORDER — CEFADROXIL 500 MG PO CAPS: 500.0000 mg | ORAL_CAPSULE | Freq: Two times a day (BID) | ORAL | 0 refills | Status: DC

## 2022-07-15 MED ORDER — BLOOD PRESSURE MONITOR KIT: 1.0000 | PACK | Freq: Every day | 0 refills | Status: DC

## 2022-07-15 MED ORDER — ASPIRIN 81 MG PO CHEW: 81.0000 mg | CHEWABLE_TABLET | Freq: Every day | ORAL | 6 refills | Status: DC

## 2022-07-15 NOTE — Progress Notes (Signed)
Pt presents for ROB visit. No concerns at this time.

## 2022-07-15 NOTE — Progress Notes (Signed)
   LOW-RISK PREGNANCY OFFICE VISIT Patient name: Sonya Dunn MRN [id number]  Date of birth: 2000-08-24 Chief Complaint:   Routine Prenatal Visit  History of Present Illness:   Sonya Dunn is a 22 y.o. G19P1001 female at 80w3dwith an Estimated Date of Delivery: 11/01/22 being seen today for ongoing management of a low-risk pregnancy.  Today she reports no complaints. Contractions: Not present. Vag. Bleeding: None.  Movement: Present. denies leaking of fluid. Review of Systems:   Pertinent items are noted in HPI Denies abnormal vaginal discharge w/ itching/odor/irritation, headaches, visual changes, shortness of breath, chest pain, abdominal pain, severe nausea/vomiting, or problems with urination or bowel movements unless otherwise stated above. Pertinent History Reviewed:  Reviewed past medical,surgical, social, obstetrical and family history.  Reviewed problem list, medications and allergies. Physical Assessment:   Vitals:   07/15/22 1607  BP: 118/77  Pulse: 96  Weight: 192 lb (87.1 kg)  Body mass index is 35.12 kg/m.        Physical Examination:   General appearance: Well appearing, and in no distress  Mental status: Alert, oriented to person, place, and time  Skin: Warm & dry  Cardiovascular: Normal heart rate noted  Respiratory: Normal respiratory effort, no distress  Abdomen: Soft, gravid, nontender  Pelvic: Cervical exam deferred         Extremities: Edema: None  Fetal Status: Fetal Heart Rate (bpm): 153 Fundal Height: 24 cm Movement: Present    No results found for this or any previous visit (from the past 24 hour(s)).  Assessment & Plan:  1) Low-risk pregnancy G2P1001 at 258w3dith an Estimated Date of Delivery: 11/01/22   2) Supervision of other normal pregnancy, antepartum - Rx: Blood Pressure Monitor KIT; 1 kit by Does not apply route daily.  Dispense: 1 kit; Refill: 0  3) History of pre-eclampsia - Patient has not picked up Rx d/t "pharmacy in  computer not close to my house any longer." - Request to update pharmacy in computer - Rx: aspirin 81 MG chewable tablet; Chew 1 tablet (81 mg total) by mouth daily.  Dispense: 30 tablet; Refill: 6  4) [redacted] weeks gestation of pregnancy    Meds:  Meds ordered this encounter  Medications   aspirin 81 MG chewable tablet    Sig: Chew 1 tablet (81 mg total) by mouth daily.    Dispense:  30 tablet    Refill:  6    Order Specific Question:   Supervising Provider    Answer:   PRDonnamae [id number] Labs/procedures today: none  Plan:  Continue routine obstetrical care   Reviewed: Preterm labor symptoms and general obstetric precautions including but not limited to vaginal bleeding, contractions, leaking of fluid and fetal movement were reviewed in detail with the patient.  All questions were answered. Has home bp cuff. Check bp weekly, let usKoreanow if >140/90.   Follow-up: Return in about 4 weeks (around 08/12/2022) for Return OB 2hr GTT.  No orders of the defined types were placed in this encounter.  RoLaury DeepSN, CNM 07/15/2022 4:22 PM

## 2022-07-15 NOTE — Patient Instructions (Signed)
Oral Glucose Tolerance Test During Pregnancy Why am I having this test? The oral glucose tolerance test (OGTT) is done to check how your body processes blood sugar (glucose). This is one of several tests used to diagnose diabetes that develops during pregnancy (gestational diabetes mellitus). Gestational diabetes is a short-term form of diabetes that some women develop while they are pregnant. It usually occurs during the second trimester of pregnancy and goes away after delivery. Testing, or screening, for gestational diabetes usually occurs at weeks 24-28 of pregnancy. You may have the OGTT test after having a 1-hour glucose screening test if the results from that test indicate that you may have gestational diabetes. This test may also be needed if: You have a history of gestational diabetes. There is a history of giving birth to very large babies or of losing pregnancies (having stillbirths). You have signs and symptoms of diabetes, such as: Changes in your eyesight. Tingling or numbness in your hands or feet. Changes in hunger, thirst, and urination, and these are not explained by your pregnancy. What is being tested? This test measures the amount of glucose in your blood at different times during a period of 3 hours. This shows how well your body can process glucose. What kind of sample is taken? Blood samples are required for this test. They are usually collected by inserting a needle into a blood vessel. How do I prepare for this test? For 3 days before your test, eat normally. Have plenty of carbohydrate-rich foods. Follow instructions from your health care provider about: Eating or drinking restrictions on the day of the test. You may be asked not to eat or drink anything other than water (to fast) starting 8-10 hours before the test. Changing or stopping your regular medicines. Some medicines may interfere with this test. Tell a health care provider about: All medicines you are taking,  including vitamins, herbs, eye drops, creams, and over-the-counter medicines. Any blood disorders you have. Any surgeries you have had. Any medical conditions you have. What happens during the test? First, your blood glucose will be measured. This is referred to as your fasting blood glucose because you fasted before the test. Then, you will drink a glucose solution that contains a certain amount of glucose. Your blood glucose will be measured again 1, 2, and 3 hours after you drink the solution. This test takes about 3 hours to complete. You will need to stay at the testing location during this time. During the testing period: Do not eat or drink anything other than the glucose solution. Do not exercise. Do not use any products that contain nicotine or tobacco, such as cigarettes, e-cigarettes, and chewing tobacco. These can affect your test results. If you need help quitting, ask your health care provider. The testing procedure may vary among health care providers and hospitals. How are the results reported? Your results will be reported as milligrams of glucose per deciliter of blood (mg/dL) or millimoles per liter (mmol/L). There is more than one source for screening and diagnosis reference values used to diagnose gestational diabetes. Your health care provider will compare your results to normal values that were established after testing a large group of people (reference values). Reference values may vary among labs and hospitals. For this test (Carpenter-Coustan), reference values are: Fasting: 95 mg/dL (5.3 mmol/L). 1 hour: 180 mg/dL (10.0 mmol/L). 2 hour: 155 mg/dL (8.6 mmol/L). 3 hour: 140 mg/dL (7.8 mmol/L). What do the results mean? Results below the reference values are considered  normal. If two or more of your blood glucose levels are at or above the reference values, you may be diagnosed with gestational diabetes. If only one level is high, your health care provider may suggest  repeat testing or other tests to confirm a diagnosis. Talk with your health care provider about what your results mean. Questions to ask your health care provider Ask your health care provider, or the department that is doing the test: When will my results be ready? How will I get my results? What are my treatment options? What other tests do I need? What are my next steps? Summary The oral glucose tolerance test (OGTT) is one of several tests used to diagnose diabetes that develops during pregnancy (gestational diabetes mellitus). Gestational diabetes is a short-term form of diabetes that some women develop while they are pregnant. You may have the OGTT test after having a 1-hour glucose screening test if the results from that test show that you may have gestational diabetes. You may also have this test if you have any symptoms or risk factors for this type of diabetes. Talk with your health care provider about what your results mean. This information is not intended to replace advice given to you by your health care provider. Make sure you discuss any questions you have with your health care provider. Document Revised: 12/16/2021 Document Reviewed: 10/19/2019 Elsevier Patient Education  [place].

## 2022-07-18 NOTE — Progress Notes (Incomplete)
   LOW-RISK PREGNANCY OFFICE VISIT Patient name: Sonya Dunn MRN [id number]  Date of birth: [birth year] Chief Complaint:   Routine Prenatal Visit  History of Present Illness:   Sonya Dunn is a 22 y.o. G86P1001 female at 62w3dwith an Estimated Date of Delivery: 11/01/22 being seen today for ongoing management of a low-risk pregnancy.  Today she reports no complaints. Contractions: Not present. Vag. Bleeding: None.  Movement: Present. denies leaking of fluid. Review of Systems:   Pertinent items are noted in HPI Denies abnormal vaginal discharge w/ itching/odor/irritation, headaches, visual changes, shortness of breath, chest pain, abdominal pain, severe nausea/vomiting, or problems with urination or bowel movements unless otherwise stated above. Pertinent History Reviewed:  Reviewed past medical,surgical, social, obstetrical and family history.  Reviewed problem list, medications and allergies. Physical Assessment:   Vitals:   07/15/22 1607  BP: 118/77  Pulse: 96  Weight: 192 lb (87.1 kg)  Body mass index is 35.12 kg/m.        Physical Examination:   General appearance: Well appearing, and in no distress  Mental status: Alert, oriented to person, place, and time  Skin: Warm & dry  Cardiovascular: Normal heart rate noted  Respiratory: Normal respiratory effort, no distress  Abdomen: Soft, gravid, nontender  Pelvic: Cervical exam deferred         Extremities: Edema: None  Fetal Status: Fetal Heart Rate (bpm): 153 Fundal Height: 24 cm Movement: Present    No results found for this or any previous visit (from the past 24 hour(s)).  Assessment & Plan:  1) Low-risk pregnancy G2P1001 at 234w3dith an Estimated Date of Delivery: 11/01/22   2) ***, ***   Meds:  Meds ordered this encounter  Medications  . aspirin 81 MG chewable tablet    Sig: Chew 1 tablet (81 mg total) by mouth daily.    Dispense:  30 tablet    Refill:  6    Order Specific Question:   Supervising  Provider    Answer:   PRDonnamae [id number] Labs/procedures today: ***  Plan:  Continue routine obstetrical care ***  Reviewed: {Blank single:19197::"Term","Preterm"} labor symptoms and general obstetric precautions including but not limited to vaginal bleeding, contractions, leaking of fluid and fetal movement were reviewed in detail with the patient.  All questions were answered. *** home bp cuff. Rx faxed to ***. Check bp weekly, let usKoreanow if >140/90.   Follow-up: Return in about 4 weeks (around 08/12/2022) for Return OB 2hr GTT.  No orders of the defined types were placed in this encounter.  RoLaury DeepSN, CNM 07/15/2022 4:22 PM

## 2022-08-12 ENCOUNTER — Other Ambulatory Visit: Payer: Medicaid Other

## 2022-08-12 ENCOUNTER — Encounter: Payer: Self-pay | Admitting: Obstetrics and Gynecology

## 2022-08-12 ENCOUNTER — Ambulatory Visit (INDEPENDENT_AMBULATORY_CARE_PROVIDER_SITE_OTHER): Payer: Medicaid Other | Admitting: Obstetrics and Gynecology

## 2022-08-12 VITALS — BP 100/69 | HR 86 | Wt 198.0 lb

## 2022-08-12 DIAGNOSIS — Z3482 Encounter for supervision of other normal pregnancy, second trimester: Secondary | ICD-10-CM | POA: Diagnosis not present

## 2022-08-12 DIAGNOSIS — Z3A28 28 weeks gestation of pregnancy: Secondary | ICD-10-CM | POA: Diagnosis not present

## 2022-08-12 DIAGNOSIS — Z1339 Encounter for screening examination for other mental health and behavioral disorders: Secondary | ICD-10-CM | POA: Diagnosis not present

## 2022-08-12 DIAGNOSIS — Z8759 Personal history of other complications of pregnancy, childbirth and the puerperium: Secondary | ICD-10-CM

## 2022-08-12 DIAGNOSIS — Z3480 Encounter for supervision of other normal pregnancy, unspecified trimester: Secondary | ICD-10-CM

## 2022-08-12 DIAGNOSIS — O99210 Obesity complicating pregnancy, unspecified trimester: Secondary | ICD-10-CM | POA: Insufficient documentation

## 2022-08-12 NOTE — Progress Notes (Addendum)
   PRENATAL VISIT NOTE  Subjective:  Sonya Dunn is a 22 y.o. G2P1001 at [redacted]w[redacted]d being seen today for ongoing prenatal care.  She is currently monitored for the following issues for this low-risk pregnancy and has History of pre-eclampsia; Supervision of other normal pregnancy, antepartum; Alpha thalassemia silent carrier; and Maternal obesity affecting pregnancy, antepartum on their problem list.  Patient reports no complaints.  Contractions: Not present. Vag. Bleeding: None.  Movement: Present. Denies leaking of fluid.   The following portions of the patient's history were reviewed and updated as appropriate: allergies, current medications, past family history, past medical history, past social history, past surgical history and problem list.   Objective:   Vitals:   08/12/22 0839  BP: 100/69  Pulse: 86  Weight: 198 lb (89.8 kg)    Fetal Status: Fetal Heart Rate (bpm): 138 Fundal Height: 28 cm Movement: Present     General:  Alert, oriented and cooperative. Patient is in no acute distress.  Skin: Skin is warm and dry. No rash noted.   Cardiovascular: Normal heart rate noted  Respiratory: Normal respiratory effort, no problems with respiration noted  Abdomen: Soft, gravid, appropriate for gestational age.  Pain/Pressure: Absent     Pelvic: Cervical exam deferred        Extremities: Normal range of motion.  Edema: Trace  Mental Status: Normal mood and affect. Normal behavior. Normal judgment and thought content.   Assessment and Plan:  Pregnancy: G2P1001 at [redacted]w[redacted]d 1. Supervision of other normal pregnancy, antepartum Patient is doing well without complaints Third trimester labs today Patient had an episode of emesis following glucola- will attempt next visit Patient is undecided on contraception  2. History of pre-eclampsia Normotensive  Continue ASA  3. Obesity affecting pregnancy, antepartum, unspecified obesity type Follow up growth ultrasound on 3/25  Preterm  labor symptoms and general obstetric precautions including but not limited to vaginal bleeding, contractions, leaking of fluid and fetal movement were reviewed in detail with the patient. Please refer to After Visit Summary for other counseling recommendations.   Return in about 2 weeks (around 08/26/2022) for ROB, Low risk, virtual or in person (patient choice).  Future Appointments  Date Time Provider [place]  08/12/2022  9:55 AM Re[facility] Pienta, Vickii Chafe, MD [facility] None  08/17/2022  1:15 PM [facility] NURSE [facility]  08/17/2022  1:30 PM [facility]2 [facility]    Mora Bellman, MD

## 2022-08-12 NOTE — Progress Notes (Signed)
ROB/GTT. Declined TDAp vaccine.

## 2022-08-17 ENCOUNTER — Ambulatory Visit: Payer: Medicaid Other | Attending: Obstetrics

## 2022-08-17 ENCOUNTER — Encounter: Payer: Self-pay | Admitting: *Deleted

## 2022-08-17 ENCOUNTER — Other Ambulatory Visit: Payer: Self-pay | Admitting: *Deleted

## 2022-08-17 ENCOUNTER — Ambulatory Visit: Payer: Medicaid Other | Admitting: *Deleted

## 2022-08-17 VITALS — BP 122/72 | HR 88

## 2022-08-17 DIAGNOSIS — O99213 Obesity complicating pregnancy, third trimester: Secondary | ICD-10-CM | POA: Diagnosis not present

## 2022-08-17 DIAGNOSIS — F1290 Cannabis use, unspecified, uncomplicated: Secondary | ICD-10-CM | POA: Diagnosis not present

## 2022-08-17 DIAGNOSIS — O09293 Supervision of pregnancy with other poor reproductive or obstetric history, third trimester: Secondary | ICD-10-CM

## 2022-08-17 DIAGNOSIS — O99323 Drug use complicating pregnancy, third trimester: Secondary | ICD-10-CM | POA: Diagnosis not present

## 2022-08-17 DIAGNOSIS — O99320 Drug use complicating pregnancy, unspecified trimester: Secondary | ICD-10-CM

## 2022-08-17 DIAGNOSIS — O99210 Obesity complicating pregnancy, unspecified trimester: Secondary | ICD-10-CM | POA: Insufficient documentation

## 2022-08-17 DIAGNOSIS — D563 Thalassemia minor: Secondary | ICD-10-CM | POA: Insufficient documentation

## 2022-08-17 DIAGNOSIS — E669 Obesity, unspecified: Secondary | ICD-10-CM

## 2022-08-17 DIAGNOSIS — Z3A29 29 weeks gestation of pregnancy: Secondary | ICD-10-CM | POA: Diagnosis not present

## 2022-08-17 DIAGNOSIS — Z3480 Encounter for supervision of other normal pregnancy, unspecified trimester: Secondary | ICD-10-CM | POA: Insufficient documentation

## 2022-08-27 ENCOUNTER — Ambulatory Visit (INDEPENDENT_AMBULATORY_CARE_PROVIDER_SITE_OTHER): Payer: Medicaid Other | Admitting: Student

## 2022-08-27 ENCOUNTER — Other Ambulatory Visit: Payer: Medicaid Other

## 2022-08-27 ENCOUNTER — Encounter: Payer: Self-pay | Admitting: Student

## 2022-08-27 VITALS — BP 110/71 | HR 84 | Wt 196.0 lb

## 2022-08-27 DIAGNOSIS — O99013 Anemia complicating pregnancy, third trimester: Secondary | ICD-10-CM

## 2022-08-27 DIAGNOSIS — Z3A30 30 weeks gestation of pregnancy: Secondary | ICD-10-CM | POA: Diagnosis not present

## 2022-08-27 DIAGNOSIS — Z3483 Encounter for supervision of other normal pregnancy, third trimester: Secondary | ICD-10-CM | POA: Diagnosis not present

## 2022-08-27 DIAGNOSIS — Z1339 Encounter for screening examination for other mental health and behavioral disorders: Secondary | ICD-10-CM

## 2022-08-27 DIAGNOSIS — O99213 Obesity complicating pregnancy, third trimester: Secondary | ICD-10-CM

## 2022-08-27 DIAGNOSIS — O99210 Obesity complicating pregnancy, unspecified trimester: Secondary | ICD-10-CM

## 2022-08-27 DIAGNOSIS — Z3480 Encounter for supervision of other normal pregnancy, unspecified trimester: Secondary | ICD-10-CM

## 2022-08-27 DIAGNOSIS — Z8759 Personal history of other complications of pregnancy, childbirth and the puerperium: Secondary | ICD-10-CM

## 2022-08-27 MED ORDER — COMFORT FIT MATERNITY SUPP LG MISC: 1.0000 [IU] | Freq: Every day | 0 refills | Status: DC

## 2022-08-27 NOTE — Progress Notes (Signed)
Pt presents for ROB visit. Pt requesting Rx for maternity belt. No other concerns.

## 2022-08-27 NOTE — Progress Notes (Signed)
   PRENATAL VISIT NOTE  Subjective:  Sonya Dunn is a 22 y.o. G2P1001 at [redacted]w[redacted]d being seen today for ongoing prenatal care.  She is currently monitored for the following issues for this low-risk pregnancy and has History of pre-eclampsia; Supervision of other normal pregnancy, antepartum; Alpha thalassemia silent carrier; and Maternal obesity affecting pregnancy, antepartum on their problem list.  Patient reports no complaints.  Contractions: Irritability. Vag. Bleeding: None.  Movement: Present. Denies leaking of fluid.   The following portions of the patient's history were reviewed and updated as appropriate: allergies, current medications, past family history, past medical history, past social history, past surgical history and problem list.   Objective:   Vitals:   08/27/22 0923  BP: 110/71  Pulse: 84  Weight: 196 lb (88.9 kg)    Fetal Status: Fetal Heart Rate (bpm): 141   Movement: Present     General:  Alert, oriented and cooperative. Patient is in no acute distress.  Skin: Skin is warm and dry. No rash noted.   Cardiovascular: Normal heart rate noted  Respiratory: Normal respiratory effort, no problems with respiration noted  Abdomen: Soft, gravid, appropriate for gestational age.  Pain/Pressure: Present     Pelvic: Cervical exam deferred        Extremities: Normal range of motion.  Edema: Trace  Mental Status: Normal mood and affect. Normal behavior. Normal judgment and thought content.   Assessment and Plan:  Pregnancy: G2P1001 at [redacted]w[redacted]d 1. Supervision of other normal pregnancy, antepartum - Doing well overall, reports vigorous and frequent fetal movement  2. [redacted] weeks gestation of pregnancy - Third trimester labs collected today  3. History of pre-eclampsia - f/u growth scan scheduled, per mfm  - ASA has been recommended -normotensive today - Patient able to verbalize S/S of Pre-E  4. Obesity affecting pregnancy, antepartum, unspecified obesity type - f/u  growth scan scheduled  Preterm labor symptoms and general obstetric precautions including but not limited to vaginal bleeding, contractions, leaking of fluid and fetal movement were reviewed in detail with the patient. Please refer to After Visit Summary for other counseling recommendations.   Return in about 2 weeks (around 09/10/2022) for LOB, IN-PERSON.  Future Appointments  Date Time Provider [place]  09/28/2022  3:30 PM [facility] NURSE [facility]  09/28/2022  3:45 PM [facility] US4 [facility] [place]    Johnston Ebbs, NP

## 2022-08-28 LAB — RPR: RPR Ser Ql: NONREACTIVE

## 2022-08-28 LAB — CBC
Hematocrit: 28.3 % — ABNORMAL LOW (ref 34.0–46.6)
Hemoglobin: 9.4 g/dL — ABNORMAL LOW (ref 11.1–15.9)
MCH: 28.1 pg (ref 26.6–33.0)
MCHC: 33.2 g/dL (ref 31.5–35.7)
MCV: 85 fL (ref 79–97)
Platelets: 198 10*3/uL (ref 150–450)
RBC: 3.35 x10E6/uL — ABNORMAL LOW (ref 3.77–5.28)
RDW: 11.9 % (ref 11.7–15.4)
WBC: 7.8 10*3/uL (ref 3.4–10.8)

## 2022-08-28 LAB — GLUCOSE TOLERANCE, 2 HOURS W/ 1HR
Glucose, 1 hour: 109 mg/dL (ref 70–179)
Glucose, 2 hour: 99 mg/dL (ref 70–152)
Glucose, Fasting: 64 mg/dL — ABNORMAL LOW (ref 70–91)

## 2022-08-28 LAB — HIV ANTIBODY (ROUTINE TESTING W REFLEX): HIV Screen 4th Generation wRfx: NONREACTIVE

## 2022-08-31 ENCOUNTER — Encounter: Payer: Self-pay | Admitting: Student

## 2022-08-31 ENCOUNTER — Other Ambulatory Visit: Payer: Self-pay

## 2022-08-31 ENCOUNTER — Telehealth: Payer: Self-pay

## 2022-08-31 ENCOUNTER — Encounter (HOSPITAL_COMMUNITY): Payer: Self-pay | Admitting: Family Medicine

## 2022-08-31 ENCOUNTER — Inpatient Hospital Stay (HOSPITAL_COMMUNITY)
Admission: AD | Admit: 2022-08-31 | Discharge: 2022-08-31 | Disposition: A | Discharge status: Home / Self Care | Payer: Medicaid Other | Attending: Family Medicine | Admitting: Family Medicine

## 2022-08-31 DIAGNOSIS — O26893 Other specified pregnancy related conditions, third trimester: Secondary | ICD-10-CM | POA: Insufficient documentation

## 2022-08-31 DIAGNOSIS — O98813 Other maternal infectious and parasitic diseases complicating pregnancy, third trimester: Secondary | ICD-10-CM | POA: Diagnosis not present

## 2022-08-31 DIAGNOSIS — M25552 Pain in left hip: Secondary | ICD-10-CM | POA: Diagnosis not present

## 2022-08-31 DIAGNOSIS — O99213 Obesity complicating pregnancy, third trimester: Secondary | ICD-10-CM

## 2022-08-31 DIAGNOSIS — Z3A31 31 weeks gestation of pregnancy: Secondary | ICD-10-CM | POA: Diagnosis not present

## 2022-08-31 DIAGNOSIS — O99013 Anemia complicating pregnancy, third trimester: Secondary | ICD-10-CM | POA: Insufficient documentation

## 2022-08-31 DIAGNOSIS — Z3480 Encounter for supervision of other normal pregnancy, unspecified trimester: Secondary | ICD-10-CM

## 2022-08-31 DIAGNOSIS — O99210 Obesity complicating pregnancy, unspecified trimester: Secondary | ICD-10-CM

## 2022-08-31 DIAGNOSIS — B379 Candidiasis, unspecified: Secondary | ICD-10-CM | POA: Diagnosis not present

## 2022-08-31 HISTORY — DX: Chlamydial infection, unspecified: A74.9

## 2022-08-31 HISTORY — DX: Gestational (pregnancy-induced) hypertension without significant proteinuria, unspecified trimester: O13.9

## 2022-08-31 HISTORY — DX: Depression, unspecified: F32.A

## 2022-08-31 HISTORY — DX: Anxiety disorder, unspecified: F41.9

## 2022-08-31 LAB — WET PREP, GENITAL
Clue Cells Wet Prep HPF POC: NONE SEEN
Sperm: NONE SEEN
Trich, Wet Prep: NONE SEEN
WBC, Wet Prep HPF POC: >=10 — AB (ref <10)

## 2022-08-31 LAB — URINALYSIS, ROUTINE W REFLEX MICROSCOPIC
Bilirubin Urine: NEGATIVE
Glucose, UA: NEGATIVE mg/dL
Hgb urine dipstick: NEGATIVE
Ketones, ur: NEGATIVE mg/dL
Nitrite: NEGATIVE
Protein, ur: 30 mg/dL — AB
Specific Gravity, Urine: 1.021 (ref 1.005–1.030)
pH: 7 (ref 5.0–8.0)

## 2022-08-31 MED ORDER — FLUCONAZOLE 150 MG PO TABS: 150.0000 mg | ORAL_TABLET | Freq: Once | ORAL | 0 refills | Status: AC

## 2022-08-31 NOTE — Telephone Encounter (Signed)
Auth Submission: NO AUTH NEEDED Site of care: Site of care: [facility] Payer: Medicaid Medication & CPT/J Code(s) submitted: Venofer (Iron Sucrose) J1756 Route of submission (phone, fax, portal):   Phone # Fax # Auth type: Buy/Bill Units/visits requested: 2 Reference number:  Approval from: 08/31/2022 to 12/31/2022        

## 2022-08-31 NOTE — MAU Provider Note (Signed)
History     CSN: [id number]  Arrival date and time: 08/31/22 1034   Event Date/Time   First Provider Initiated Contact with Patient 08/31/22 1119      Chief Complaint  Patient presents with   Contractions   HPI This is a 22 year old G2 P1-0-0-1 at 31 weeks and 1 day who presents with pelvic pressure.  This started last night and had some increased pelvic pressure this morning with some contractions when she tried to get out of bed this morning.  The pain is most significant in her left hip and she finds it difficult to move.  Her contractions are now resolved.  She feels good fetal movement.  No leaking fluid.  She does have some mild vaginal discharge.  OB History     Gravida  2   Para  1   Term  1   Preterm      AB      Living  1      SAB      IAB      Ectopic      Multiple  0   Live Births  1        Obstetric Comments  Ind for pre-e with first         Past Medical History:  Diagnosis Date   Anxiety    Chlamydia    Depression    Geographical tongue    Headache    Pregnancy induced hypertension    Seasonal allergies     Past Surgical History:  Procedure Laterality Date   EYE SURGERY      Family History  Problem Relation Age of Onset   Healthy Mother    Healthy Father    Hypertension Paternal Grandfather     Social History   Tobacco Use   Smoking status: Never   Smokeless tobacco: Never  Vaping Use   Vaping Use: Never used  Substance Use Topics   Alcohol use: Never   Drug use: Not Currently    Types: Marijuana    Comment: more than 30days    Allergies:  Allergies  Allergen Reactions   Other     Seasonal    Medications Prior to Admission  Medication Sig Dispense Refill Last Dose   aspirin 81 MG chewable tablet Chew 1 tablet (81 mg total) by mouth daily. 30 tablet 6 08/30/2022   Prenatal Vit-Fe Fumarate-FA (PREPLUS) 27-1 MG TABS Take 1 tablet by mouth daily. 30 tablet 13 08/30/2022   Blood Pressure Monitor KIT 1 kit by Does  not apply route daily. 1 kit 0    famotidine (PEPCID) 20 MG tablet Take 1 tablet (20 mg total) by mouth 2 (two) times daily. 60 tablet 3    glycopyrrolate (ROBINUL) 1 MG tablet Take 1-2 tablets (1-2 mg total) by mouth 3 (three) times daily. (Patient not taking: Reported on 08/27/2022) 90 tablet 2    ondansetron (ZOFRAN-ODT) 8 MG disintegrating tablet Take 1 tablet (8 mg total) by mouth every 8 (eight) hours as needed for nausea or vomiting. 60 tablet 2 08/27/2022    Review of Systems Physical Exam   Blood pressure 113/62, pulse 93, temperature 99.1 F (37.3 C), temperature source Oral, resp. rate 16, height 5\' 2"  (1.575 m), weight 88.4 kg, last menstrual period 12/29/2021, SpO2 100 %.  Physical Exam Vitals reviewed. Exam conducted with a chaperone present.  Constitutional:      Appearance: Normal appearance.  Pulmonary:     Effort: Pulmonary effort is  normal.  Abdominal:     General: Abdomen is flat. There is no distension.     Palpations: Abdomen is soft.     Tenderness: There is no abdominal tenderness. There is no guarding or rebound.  Genitourinary:    General: Normal vulva.  Neurological:     Mental Status: She is alert.  Psychiatric:        Mood and Affect: Mood normal.        Behavior: Behavior normal.        Thought Content: Thought content normal.        Judgment: Judgment normal.   Dilation: Closed Effacement (%): Thick Cervical Position: Posterior Station: Ballotable Exam by:: Adrienna Karis, J   MAU Course  Procedures NST:  Baseline: 140  Variability: moderate Accelerations: 10x10 present  Decelerations: none Contractions: none  MDM Yeast on wet prep  Assessment and Plan   1. Supervision of other normal pregnancy, antepartum   2. Obesity affecting pregnancy, antepartum, unspecified obesity type   3. [redacted] weeks gestation of pregnancy   4. Yeast infection    Flagyl prescribed.  Discussed strategies for pelvic pressure. Not in labor.  F/u in office.  Sonya Dunn 08/31/2022, 12:05 PM

## 2022-08-31 NOTE — MAU Note (Signed)
Denies vag d/c, but states has a lot of swelling and some itching

## 2022-08-31 NOTE — MAU Note (Signed)
Sonya Dunn is a 23 y.o. at [redacted]w[redacted]d here in MAU reporting: she has constant hip and pelvic pain that's worsening.  Denies taking any OTC meds to treat.  Reports had ctxs that were 2 minutes apart, but have stopped.  Denies VB or LOF.  Endorses +FM. LMP: NA Onset of complaint: today Pain score: 8 Vitals:   08/31/22 1047  BP: 116/73  Pulse: 88  Resp: 18  Temp: 99.1 F (37.3 C)  SpO2: 99%     URK:YHCWCBJS to maternal apparel Lab orders placed from triage:   UA

## 2022-08-31 NOTE — Addendum Note (Signed)
Addended by: Corlis Hove on: 08/31/2022 10:59 AM   Modules accepted: Orders

## 2022-09-01 LAB — GC/CHLAMYDIA PROBE AMP (~~LOC~~) NOT AT ARMC
Chlamydia: NEGATIVE
Comment: NEGATIVE
Comment: NORMAL
Neisseria Gonorrhea: NEGATIVE

## 2022-09-10 ENCOUNTER — Ambulatory Visit (INDEPENDENT_AMBULATORY_CARE_PROVIDER_SITE_OTHER): Payer: Medicaid Other | Admitting: Obstetrics

## 2022-09-10 ENCOUNTER — Encounter: Payer: Self-pay | Admitting: Obstetrics

## 2022-09-10 VITALS — BP 101/65 | HR 94 | Wt 200.0 lb

## 2022-09-10 DIAGNOSIS — O99210 Obesity complicating pregnancy, unspecified trimester: Secondary | ICD-10-CM

## 2022-09-10 DIAGNOSIS — Z8759 Personal history of other complications of pregnancy, childbirth and the puerperium: Secondary | ICD-10-CM

## 2022-09-10 DIAGNOSIS — O99013 Anemia complicating pregnancy, third trimester: Secondary | ICD-10-CM

## 2022-09-10 DIAGNOSIS — Z3480 Encounter for supervision of other normal pregnancy, unspecified trimester: Secondary | ICD-10-CM

## 2022-09-10 DIAGNOSIS — D563 Thalassemia minor: Secondary | ICD-10-CM

## 2022-09-10 DIAGNOSIS — J301 Allergic rhinitis due to pollen: Secondary | ICD-10-CM

## 2022-09-10 MED ORDER — CETIRIZINE HCL 10 MG PO TABS
10.0000 mg | ORAL_TABLET | Freq: Every day | ORAL | 11 refills | Status: DC
Start: 2022-09-10 — End: 2023-09-22

## 2022-09-10 NOTE — Progress Notes (Signed)
Subjective:  Sonya Dunn is a 22 y.o. G2P1001 at [redacted]w[redacted]d being seen today for ongoing prenatal care.  She is currently monitored for the following issues for this low-risk pregnancy and has History of pre-eclampsia; Supervision of other normal pregnancy, antepartum; Alpha thalassemia silent carrier; Maternal obesity affecting pregnancy, antepartum; and Anemia affecting pregnancy in third trimester on their problem list.  Patient reports  seasonal allergies .  Contractions: Not present. Vag. Bleeding: None.  Movement: Present. Denies leaking of fluid.   The following portions of the patient's history were reviewed and updated as appropriate: allergies, current medications, past family history, past medical history, past social history, past surgical history and problem list. Problem list updated.  Objective:   Vitals:   09/10/22 1023  BP: 101/65  Pulse: 94  Weight: 200 lb (90.7 kg)    Fetal Status: Fetal Heart Rate (bpm): 155   Movement: Present     General:  Alert, oriented and cooperative. Patient is in no acute distress.  Skin: Skin is warm and dry. No rash noted.   Cardiovascular: Normal heart rate noted  Respiratory: Normal respiratory effort, no problems with respiration noted  Abdomen: Soft, gravid, appropriate for gestational age. Pain/Pressure: Present     Pelvic:  Cervical exam deferred        Extremities: Normal range of motion.  Edema: Trace  Mental Status: Normal mood and affect. Normal behavior. Normal judgment and thought content.   Urinalysis:      Assessment and Plan:  Pregnancy: G2P1001 at [redacted]w[redacted]d  1. Supervision of other normal pregnancy, antepartum  2. History of pre-eclampsia - Baby ASA Rx  3. Anemia affecting pregnancy in third trimester  4. Obesity affecting pregnancy, antepartum, unspecified obesity type  5. Alpha thalassemia silent carrier   Preterm labor symptoms and general obstetric precautions including but not limited to vaginal bleeding,  contractions, leaking of fluid and fetal movement were reviewed in detail with the patient. Please refer to After Visit Summary for other counseling recommendations.   Return in about 2 weeks (around 09/24/2022) for ROB.   Brock Bad, MD 09/10/22

## 2022-09-16 ENCOUNTER — Telehealth: Payer: Self-pay | Admitting: Pharmacy Technician

## 2022-09-16 NOTE — Telephone Encounter (Addendum)
Joni Reining,  ?  Please advise.  Auth Submission: NO AUTH NEEDED Site of care: Site of care: [facility] Payer: [facility] Medication & CPT/J Code(s) submitted:  VENOFER Route of submission (phone, fax, portal):  Phone # Fax # Auth type: Buy/Bill Units/visits requested: 2 Reference number:  Approval from: 09/16/22 to 01/16/23

## 2022-09-24 ENCOUNTER — Ambulatory Visit (INDEPENDENT_AMBULATORY_CARE_PROVIDER_SITE_OTHER): Payer: Medicaid Other | Admitting: Obstetrics and Gynecology

## 2022-09-24 VITALS — BP 103/68 | HR 85 | Wt 201.0 lb

## 2022-09-24 DIAGNOSIS — O99210 Obesity complicating pregnancy, unspecified trimester: Secondary | ICD-10-CM

## 2022-09-24 DIAGNOSIS — Z3480 Encounter for supervision of other normal pregnancy, unspecified trimester: Secondary | ICD-10-CM

## 2022-09-24 DIAGNOSIS — Z3A34 34 weeks gestation of pregnancy: Secondary | ICD-10-CM

## 2022-09-24 DIAGNOSIS — O26893 Other specified pregnancy related conditions, third trimester: Secondary | ICD-10-CM

## 2022-09-24 DIAGNOSIS — Z8759 Personal history of other complications of pregnancy, childbirth and the puerperium: Secondary | ICD-10-CM

## 2022-09-24 DIAGNOSIS — D563 Thalassemia minor: Secondary | ICD-10-CM

## 2022-09-24 DIAGNOSIS — O99013 Anemia complicating pregnancy, third trimester: Secondary | ICD-10-CM

## 2022-09-24 DIAGNOSIS — R519 Headache, unspecified: Secondary | ICD-10-CM

## 2022-09-24 MED ORDER — ACETAMINOPHEN-CAFFEINE 500-65 MG PO TABS
1.0000 | ORAL_TABLET | Freq: Three times a day (TID) | ORAL | 0 refills | Status: DC | PRN
Start: 2022-09-24 — End: 2022-10-28

## 2022-09-24 NOTE — Progress Notes (Signed)
   PRENATAL VISIT NOTE  Subjective:  Sonya Dunn is a 22 y.o. G2P1001 at [redacted]w[redacted]d being seen today for ongoing prenatal care.  She is currently monitored for the following issues for this low-risk pregnancy and has History of pre-eclampsia; Supervision of other normal pregnancy, antepartum; Alpha thalassemia silent carrier; Maternal obesity affecting pregnancy, antepartum; and Anemia affecting pregnancy in third trimester on their problem list.  Patient doing well with no acute concerns today. She reports backache and headache.  No current headache.  Contractions: Not present. Vag. Bleeding: None.  Movement: Present. Denies leaking of fluid.   The following portions of the patient's history were reviewed and updated as appropriate: allergies, current medications, past family history, past medical history, past social history, past surgical history and problem list. Problem list updated.  Objective:   Vitals:   09/24/22 1005  BP: 103/68  Pulse: 85  Weight: 201 lb (91.2 kg)    Fetal Status: Fetal Heart Rate (bpm): 140 Fundal Height: 35 cm Movement: Present     General:  Alert, oriented and cooperative. Patient is in no acute distress.  Skin: Skin is warm and dry. No rash noted.   Cardiovascular: Normal heart rate noted  Respiratory: Normal respiratory effort, no problems with respiration noted  Abdomen: Soft, gravid, appropriate for gestational age.  Pain/Pressure: Present     Pelvic: Cervical exam deferred        Extremities: Normal range of motion.     Mental Status:  Normal mood and affect. Normal behavior. Normal judgment and thought content.   Assessment and Plan:  Pregnancy: G2P1001 at [redacted]w[redacted]d  1. [redacted] weeks gestation of pregnancy   2. Supervision of other normal pregnancy, antepartum Continue routine prenatal care  3. Obesity affecting pregnancy, antepartum, unspecified obesity type   4. History of pre-eclampsia No s/sx of preeclampsia  5. Anemia affecting pregnancy  in third trimester   6. Alpha thalassemia silent carrier   7. Pregnancy headache in third trimester Trial of tylenol with caffeine Preeclampsia precautions given  - acetaminophen-caffeine (EXCEDRIN TENSION HEADACHE) 500-65 MG TABS per tablet; Take 1 tablet by mouth every 8 (eight) hours as needed.  Dispense: 60 tablet; Refill: 0  Preterm labor symptoms and general obstetric precautions including but not limited to vaginal bleeding, contractions, leaking of fluid and fetal movement were reviewed in detail with the patient.  Please refer to After Visit Summary for other counseling recommendations.   Return in about 2 weeks (around 10/08/2022) for ROB, in person, 36 weeks swabs.   Mariel Aloe, MD Faculty Attending [facility]

## 2022-09-24 NOTE — Progress Notes (Signed)
Pt states she has been having migraines and increasing back pain.

## 2022-09-28 ENCOUNTER — Encounter: Payer: Self-pay | Admitting: *Deleted

## 2022-09-28 ENCOUNTER — Encounter: Payer: Self-pay | Admitting: Student

## 2022-09-28 ENCOUNTER — Ambulatory Visit: Payer: Medicaid Other | Admitting: *Deleted

## 2022-09-28 ENCOUNTER — Ambulatory Visit: Payer: Medicaid Other | Attending: Obstetrics and Gynecology

## 2022-09-28 VITALS — BP 122/64 | HR 84

## 2022-09-28 DIAGNOSIS — E669 Obesity, unspecified: Secondary | ICD-10-CM

## 2022-09-28 DIAGNOSIS — O99013 Anemia complicating pregnancy, third trimester: Secondary | ICD-10-CM | POA: Insufficient documentation

## 2022-09-28 DIAGNOSIS — O99323 Drug use complicating pregnancy, third trimester: Secondary | ICD-10-CM

## 2022-09-28 DIAGNOSIS — O99320 Drug use complicating pregnancy, unspecified trimester: Secondary | ICD-10-CM

## 2022-09-28 DIAGNOSIS — O99213 Obesity complicating pregnancy, third trimester: Secondary | ICD-10-CM | POA: Diagnosis not present

## 2022-09-28 DIAGNOSIS — Z362 Encounter for other antenatal screening follow-up: Secondary | ICD-10-CM | POA: Insufficient documentation

## 2022-09-28 DIAGNOSIS — Z3480 Encounter for supervision of other normal pregnancy, unspecified trimester: Secondary | ICD-10-CM

## 2022-09-28 DIAGNOSIS — Z3A35 35 weeks gestation of pregnancy: Secondary | ICD-10-CM | POA: Diagnosis not present

## 2022-09-28 DIAGNOSIS — D563 Thalassemia minor: Secondary | ICD-10-CM

## 2022-09-28 DIAGNOSIS — Z148 Genetic carrier of other disease: Secondary | ICD-10-CM | POA: Insufficient documentation

## 2022-09-28 DIAGNOSIS — O99210 Obesity complicating pregnancy, unspecified trimester: Secondary | ICD-10-CM | POA: Insufficient documentation

## 2022-09-28 DIAGNOSIS — O09293 Supervision of pregnancy with other poor reproductive or obstetric history, third trimester: Secondary | ICD-10-CM | POA: Diagnosis not present

## 2022-09-28 DIAGNOSIS — F1290 Cannabis use, unspecified, uncomplicated: Secondary | ICD-10-CM | POA: Diagnosis not present

## 2022-09-28 DIAGNOSIS — O285 Abnormal chromosomal and genetic finding on antenatal screening of mother: Secondary | ICD-10-CM

## 2022-09-28 NOTE — Telephone Encounter (Signed)
We have received a shipment of Venofer today. We will proceed with venofer. Thanks.

## 2022-09-29 ENCOUNTER — Inpatient Hospital Stay (HOSPITAL_COMMUNITY)
Admission: AD | Admit: 2022-09-29 | Discharge: 2022-09-29 | Disposition: A | Discharge status: Home / Self Care | Payer: Medicaid Other | Attending: Obstetrics and Gynecology | Admitting: Obstetrics and Gynecology

## 2022-09-29 ENCOUNTER — Telehealth: Payer: Self-pay | Admitting: *Deleted

## 2022-09-29 ENCOUNTER — Inpatient Hospital Stay (HOSPITAL_BASED_OUTPATIENT_CLINIC_OR_DEPARTMENT_OTHER): Payer: Medicaid Other

## 2022-09-29 ENCOUNTER — Encounter (HOSPITAL_COMMUNITY): Payer: Self-pay | Admitting: Obstetrics and Gynecology

## 2022-09-29 DIAGNOSIS — O479 False labor, unspecified: Secondary | ICD-10-CM | POA: Diagnosis not present

## 2022-09-29 DIAGNOSIS — M5430 Sciatica, unspecified side: Secondary | ICD-10-CM | POA: Diagnosis present

## 2022-09-29 DIAGNOSIS — O26893 Other specified pregnancy related conditions, third trimester: Secondary | ICD-10-CM | POA: Diagnosis present

## 2022-09-29 DIAGNOSIS — O99891 Other specified diseases and conditions complicating pregnancy: Secondary | ICD-10-CM | POA: Diagnosis not present

## 2022-09-29 DIAGNOSIS — E669 Obesity, unspecified: Secondary | ICD-10-CM

## 2022-09-29 DIAGNOSIS — Z3A35 35 weeks gestation of pregnancy: Secondary | ICD-10-CM | POA: Insufficient documentation

## 2022-09-29 DIAGNOSIS — O99213 Obesity complicating pregnancy, third trimester: Secondary | ICD-10-CM

## 2022-09-29 DIAGNOSIS — O09293 Supervision of pregnancy with other poor reproductive or obstetric history, third trimester: Secondary | ICD-10-CM

## 2022-09-29 DIAGNOSIS — M5431 Sciatica, right side: Secondary | ICD-10-CM | POA: Diagnosis not present

## 2022-09-29 DIAGNOSIS — M549 Dorsalgia, unspecified: Secondary | ICD-10-CM | POA: Diagnosis present

## 2022-09-29 DIAGNOSIS — M25551 Pain in right hip: Secondary | ICD-10-CM | POA: Diagnosis present

## 2022-09-29 DIAGNOSIS — O288 Other abnormal findings on antenatal screening of mother: Secondary | ICD-10-CM | POA: Diagnosis not present

## 2022-09-29 DIAGNOSIS — D563 Thalassemia minor: Secondary | ICD-10-CM

## 2022-09-29 DIAGNOSIS — O99323 Drug use complicating pregnancy, third trimester: Secondary | ICD-10-CM | POA: Diagnosis not present

## 2022-09-29 DIAGNOSIS — F1290 Cannabis use, unspecified, uncomplicated: Secondary | ICD-10-CM

## 2022-09-29 DIAGNOSIS — R109 Unspecified abdominal pain: Secondary | ICD-10-CM | POA: Diagnosis not present

## 2022-09-29 DIAGNOSIS — O285 Abnormal chromosomal and genetic finding on antenatal screening of mother: Secondary | ICD-10-CM

## 2022-09-29 LAB — URINALYSIS, ROUTINE W REFLEX MICROSCOPIC
Bilirubin Urine: NEGATIVE
Glucose, UA: NEGATIVE mg/dL
Hgb urine dipstick: NEGATIVE
Ketones, ur: NEGATIVE mg/dL
Nitrite: NEGATIVE
Protein, ur: NEGATIVE mg/dL
Specific Gravity, Urine: 1.021 (ref 1.005–1.030)
pH: 6 (ref 5.0–8.0)

## 2022-09-29 MED ORDER — CYCLOBENZAPRINE HCL 5 MG PO TABS
10.0000 mg | ORAL_TABLET | Freq: Three times a day (TID) | ORAL | Status: DC | PRN
  Administered 2022-09-29: 10 mg via ORAL
  Filled 2022-09-29: qty 2

## 2022-09-29 MED ORDER — CYCLOBENZAPRINE HCL 5 MG PO TABS: 5.0000 mg | ORAL_TABLET | Freq: Three times a day (TID) | ORAL | 0 refills | Status: DC | PRN

## 2022-09-29 MED ORDER — ACETAMINOPHEN 500 MG PO TABS
1000.0000 mg | ORAL_TABLET | Freq: Four times a day (QID) | ORAL | Status: DC | PRN
  Administered 2022-09-29: 1000 mg via ORAL
  Filled 2022-09-29: qty 2

## 2022-09-29 NOTE — Telephone Encounter (Signed)
TC from pt reporting cont'd pain in left hip. Sts this is the same pain she has had for the duration of the pregnancy, but reports it is very bad today and she is unable to bear weight on that side. Advised ice or heat, rest and tylenol for pain. Pt asking if she should go to the hospital. Denies UC's, LOF, VB, decreased FM or other acute symptoms. Advised to seek care if previously mentioned interventions do not help with pain. OK to place PT referral per Dr. Clearance Coots. Pt advised PT referral would be made.

## 2022-09-29 NOTE — MAU Note (Signed)
.  Sonya Dunn is a 22 y.o. at [redacted]w[redacted]d here in MAU reporting: around 0900 today with a sharp, shooting pain on her right hip to back. Pt called her OB and was told to rest and try stretching. Pt reports it has continued and shoots up the right side of her back causing it difficult to breath due to pain, and worse with standing. Reports nausea. Pt reports taking Tylenol 500mg  at 1300, no relief. Pt endorses FM. Pt denies VB, LOF, PIH s/s, and complications in the pregnancy.   Onset of complaint: 0900 Pain score: 7 Vitals:   09/29/22 1927  BP: 117/73  Pulse: 91  Resp: 16  Temp: 98.3 F (36.8 C)  SpO2: 99%     ZOX:WRUEAV to assess in triage due to patient clothing  Lab orders placed from triage:  UA

## 2022-09-29 NOTE — MAU Provider Note (Cosign Needed Addendum)
History     CSN: [id number]  Arrival date and time: 09/29/22 1906   Event Date/Time   First Provider Initiated Contact with Patient 09/29/22 1950      Chief Complaint  Patient presents with   Shortness of Breath   Back Pain   21 y.o. G2P1001 @35 .2 wks presenting with right hip and buttock pain. Pain started this am. Pain is sharp and shoots from her right hip down her right buttock. Pain is worse with standing, stairs, and hip flexion. Pain also shoots up her back with deep breaths. Reports baby prefers to lie on her right side. She tried a warm shower which helped temporarily. Denies recent injury, pushing, pulling, or lifting. Rates pain 7/10. Denies VB, LOF, ctx. Reports good FM.     OB History     Gravida  2   Para  1   Term  1   Preterm      AB      Living  1      SAB      IAB      Ectopic      Multiple  0   Live Births  1        Obstetric Comments  Ind for pre-e with first         Past Medical History:  Diagnosis Date   Anxiety    Chlamydia    Depression    Geographical tongue    Headache    Pregnancy induced hypertension    Seasonal allergies     Past Surgical History:  Procedure Laterality Date   EYE SURGERY      Family History  Problem Relation Age of Onset   Healthy Mother    Healthy Father    Hypertension Paternal Grandfather     Social History   Tobacco Use   Smoking status: Never   Smokeless tobacco: Never  Vaping Use   Vaping Use: Never used  Substance Use Topics   Alcohol use: Never   Drug use: Not Currently    Types: Marijuana    Comment: more than 30days    Allergies:  Allergies  Allergen Reactions   Other     Seasonal    Medications Prior to Admission  Medication Sig Dispense Refill Last Dose   acetaminophen (TYLENOL) 650 MG CR tablet Take 650 mg by mouth every 8 (eight) hours as needed for pain.   09/29/2022 at 1500   Prenatal Vit-Fe Fumarate-FA (PREPLUS) 27-1 MG TABS Take 1 tablet by mouth daily. 30  tablet 13 09/29/2022   acetaminophen-caffeine (EXCEDRIN TENSION HEADACHE) 500-65 MG TABS per tablet Take 1 tablet by mouth every 8 (eight) hours as needed. 60 tablet 0    aspirin 81 MG chewable tablet Chew 1 tablet (81 mg total) by mouth daily. (Patient not taking: Reported on 09/24/2022) 30 tablet 6    Blood Pressure Monitor KIT 1 kit by Does not apply route daily. 1 kit 0    cetirizine (ZYRTEC ALLERGY) 10 MG tablet Take 1 tablet (10 mg total) by mouth daily. 30 tablet 11    famotidine (PEPCID) 20 MG tablet Take 1 tablet (20 mg total) by mouth 2 (two) times daily. 60 tablet 3    ondansetron (ZOFRAN-ODT) 8 MG disintegrating tablet Take 1 tablet (8 mg total) by mouth every 8 (eight) hours as needed for nausea or vomiting. 60 tablet 2     Review of Systems  Gastrointestinal:  Negative for abdominal pain.  Genitourinary:  Negative  for dysuria, flank pain and hematuria.  Musculoskeletal:  Positive for back pain.   Physical Exam   Blood pressure 124/71, pulse 87, temperature 98.3 F (36.8 C), temperature source Oral, resp. rate 16, height 5\' 2"  (1.575 m), weight 92.4 kg, last menstrual period 12/29/2021, SpO2 98 %.  Physical Exam Vitals and nursing note reviewed.  Constitutional:      General: She is not in acute distress.    Appearance: Normal appearance.  HENT:     Head: Normocephalic and atraumatic.  Cardiovascular:     Rate and Rhythm: Normal rate.  Pulmonary:     Effort: Pulmonary effort is normal. No respiratory distress.  Abdominal:     Palpations: Abdomen is soft.     Tenderness: There is no abdominal tenderness.  Musculoskeletal:        General: Normal range of motion.     Cervical back: Normal and normal range of motion.     Thoracic back: Tenderness present.     Lumbar back: Tenderness present.       Back:  Skin:    General: Skin is warm and dry.  Neurological:     General: No focal deficit present.     Mental Status: She is alert and oriented to person, place, and time.   Psychiatric:        Mood and Affect: Mood normal.        Behavior: Behavior normal.   EFM: 145 bpm, mod variability, no accels, no decels Toco: none  Results for orders placed or performed during the hospital encounter of 09/29/22 (from the past 24 hour(s))  Urinalysis, Routine w reflex microscopic -Urine, Clean Catch     Status: Abnormal   Collection Time: 09/29/22  7:20 PM  Result Value Ref Range   Color, Urine YELLOW YELLOW   APPearance CLOUDY (A) CLEAR   Specific Gravity, Urine 1.021 1.005 - 1.030   pH 6.0 5.0 - 8.0   Glucose, UA NEGATIVE NEGATIVE mg/dL   Hgb urine dipstick NEGATIVE NEGATIVE   Bilirubin Urine NEGATIVE NEGATIVE   Ketones, ur NEGATIVE NEGATIVE mg/dL   Protein, ur NEGATIVE NEGATIVE mg/dL   Nitrite NEGATIVE NEGATIVE   Leukocytes,Ua LARGE (A) NEGATIVE   RBC / HPF 0-5 0 - 5 RBC/hpf   WBC, UA 21-50 0 - 5 WBC/hpf   Bacteria, UA FEW (A) NONE SEEN   Squamous Epithelial / HPF 11-20 0 - 5 /HPF   Mucus PRESENT    Budding Yeast PRESENT    MAU Course  Procedures Flexeril Tylenol  MDM Labs ordered.  NST non-reactive, BPP ordered.  Transfer of care given to Lorenda Peck, CNM  09/29/2022 8:49 PM   BPP 8/8 NST reactive after BPP  Patient requested cervix check Dilation: Fingertip Effacement (%): Thick Cervical Position: Posterior Station: Ballotable Exam by:: Wynelle Bourgeois, CNM (Pt request cervical exam)   Assessment and Plan  Single IUP at [redacted]w[redacted]d Sciatica Irregular contractions without cervical change  Discharge home PTL precautions Continue supportive care and PT for Sciatica Rx Flexeril for sciatica prn Encouraged to return if she develops worsening of symptoms, increase in pain, fever, or other concerning symptoms.   Aviva Signs, CNM

## 2022-10-06 ENCOUNTER — Encounter (HOSPITAL_COMMUNITY): Payer: Self-pay | Admitting: Obstetrics and Gynecology

## 2022-10-06 ENCOUNTER — Inpatient Hospital Stay (HOSPITAL_COMMUNITY)
Admission: AD | Admit: 2022-10-06 | Discharge: 2022-10-06 | Disposition: A | Discharge status: Home / Self Care | Payer: Medicaid Other | Attending: Obstetrics and Gynecology | Admitting: Obstetrics and Gynecology

## 2022-10-06 DIAGNOSIS — Z3689 Encounter for other specified antenatal screening: Secondary | ICD-10-CM

## 2022-10-06 DIAGNOSIS — Z3A36 36 weeks gestation of pregnancy: Secondary | ICD-10-CM

## 2022-10-06 DIAGNOSIS — O368130 Decreased fetal movements, third trimester, not applicable or unspecified: Secondary | ICD-10-CM

## 2022-10-06 NOTE — MAU Provider Note (Signed)
History     [id number]  Arrival date and time: 10/06/22 2012    Chief Complaint  Patient presents with   Decreased Fetal Movement     HPI TAKEIA KRAYER is a 22 y.o. G2P1001 at [redacted]w[redacted]d by 6 week ultrasound who presents for decreased fetal movement. Reports only feeling baby move at noon & 6 pm today. Denies abdominal pain, abdominal trauma, LOF, or vaginal bleeding.    O/Positive/-- (11/14 1357)  OB History     Gravida  2   Para  1   Term  1   Preterm      AB      Living  1      SAB      IAB      Ectopic      Multiple  0   Live Births  1           Past Medical History:  Diagnosis Date   Anxiety    Chlamydia    Depression    Geographical tongue    Headache    Pregnancy induced hypertension    Seasonal allergies     Past Surgical History:  Procedure Laterality Date   EYE SURGERY      Family History  Problem Relation Age of Onset   Healthy Mother    Healthy Father    Hypertension Paternal Grandfather     Social History   Socioeconomic History   Marital status: Single    Spouse name: [place]   Number of children: Not on file   Years of education: Not on file   Highest education level: Not on file  Occupational History   Not on file  Tobacco Use   Smoking status: Never   Smokeless tobacco: Never  Vaping Use   Vaping Use: Never used  Substance and Sexual Activity   Alcohol use: Never   Drug use: Not Currently    Types: Marijuana    Comment: more than 30days   Sexual activity: Yes    Partners: Male    Birth control/protection: None  Other Topics Concern   Not on file  Social History Narrative   Not on file   Social Determinants of Health   Financial Resource Strain: Not on file  Food Insecurity: Not on file  Transportation Needs: Not on file  Physical Activity: Not on file  Stress: Not on file  Social Connections: Not on file  Intimate Partner Violence: Not on file    Allergies  Allergen Reactions   Other      Seasonal    No current facility-administered medications on file prior to encounter.   Current Outpatient Medications on File Prior to Encounter  Medication Sig Dispense Refill   Prenatal Vit-Fe Fumarate-FA (PREPLUS) 27-1 MG TABS Take 1 tablet by mouth daily. 30 tablet 13   acetaminophen (TYLENOL) 650 MG CR tablet Take 650 mg by mouth every 8 (eight) hours as needed for pain.     acetaminophen-caffeine (EXCEDRIN TENSION HEADACHE) 500-65 MG TABS per tablet Take 1 tablet by mouth every 8 (eight) hours as needed. 60 tablet 0   aspirin 81 MG chewable tablet Chew 1 tablet (81 mg total) by mouth daily. (Patient not taking: Reported on 09/24/2022) 30 tablet 6   Blood Pressure Monitor KIT 1 kit by Does not apply route daily. 1 kit 0   cetirizine (ZYRTEC ALLERGY) 10 MG tablet Take 1 tablet (10 mg total) by mouth daily. 30 tablet 11   cyclobenzaprine (FLEXERIL) 5  MG tablet Take 1 tablet (5 mg total) by mouth every 8 (eight) hours as needed for muscle spasms. 30 tablet 0   famotidine (PEPCID) 20 MG tablet Take 1 tablet (20 mg total) by mouth 2 (two) times daily. 60 tablet 3   ondansetron (ZOFRAN-ODT) 8 MG disintegrating tablet Take 1 tablet (8 mg total) by mouth every 8 (eight) hours as needed for nausea or vomiting. 60 tablet 2     ROS Pertinent positives and negative per HPI, all others reviewed and negative  Physical Exam   BP 116/71 (BP Location: Right Arm)   Pulse 94   Temp 98.5 F (36.9 C)   Resp 17   Ht 5\' 2"  (1.575 m)   Wt 93 kg   LMP 12/29/2021   SpO2 99%   BMI 37.49 kg/m   Patient Vitals for the past 24 hrs:  BP Temp Pulse Resp SpO2 Height Weight  10/06/22 2124 116/71 -- 94 -- -- -- --  10/06/22 2040 111/70 -- 97 -- 99 % -- --  10/06/22 2025 113/69 -- -- -- -- -- --  10/06/22 2022 -- 98.5 F (36.9 C) 99 17 100 % 5\' 2"  (1.575 m) 93 kg    Physical Exam Vitals and nursing note reviewed.  Constitutional:      General: She is not in acute distress.    Appearance: She is  well-developed. She is not ill-appearing.  HENT:     Head: Normocephalic and atraumatic.  Eyes:     General: No scleral icterus.       Right eye: No discharge.        Left eye: No discharge.     Conjunctiva/sclera: Conjunctivae normal.  Pulmonary:     Effort: Pulmonary effort is normal. No respiratory distress.  Neurological:     General: No focal deficit present.     Mental Status: She is alert.  Psychiatric:        Mood and Affect: Mood normal.        Behavior: Behavior normal.       FHT Baseline 145, moderate variability, 15x15 accels, no decels Toco: irregular Cat: 1  Labs No results found for this or any previous visit (from the past 24 hour(s)).  Imaging No results found.  MAU Course  Procedures Lab Orders  No laboratory test(s) ordered today   No orders of the defined types were placed in this encounter.  Imaging Orders  No imaging studies ordered today    MDM low  Assessment and Plan   1. Decreased fetal movements in third trimester, single or unspecified fetus   2. NST (non-stress test) reactive   3. [redacted] weeks gestation of pregnancy    -Reactive tracing in MAU. Patient reports return of movement & documented 11 movements during her NST. Reviewed kick counts.  #FWB: cat 1 tracing    Dispo: discharged to home in stable condition.   Discharge Instructions     Discharge patient   Complete by: As directed    Discharge disposition: 01-Home or Self Care   Discharge patient date: 10/06/2022       Judeth Horn, NP 10/06/22 10:00 PM  Allergies as of 10/06/2022       Reactions   Other    Seasonal        Medication List     TAKE these medications    acetaminophen 650 MG CR tablet Commonly known as: TYLENOL Take 650 mg by mouth every 8 (eight) hours as needed for pain.  acetaminophen-caffeine 500-65 MG Tabs per tablet Commonly known as: EXCEDRIN TENSION HEADACHE Take 1 tablet by mouth every 8 (eight) hours as needed.   aspirin  81 MG chewable tablet Chew 1 tablet (81 mg total) by mouth daily.   Blood Pressure Monitor Kit 1 kit by Does not apply route daily.   cetirizine 10 MG tablet Commonly known as: ZyrTEC Allergy Take 1 tablet (10 mg total) by mouth daily.   cyclobenzaprine 5 MG tablet Commonly known as: FLEXERIL Take 1 tablet (5 mg total) by mouth every 8 (eight) hours as needed for muscle spasms.   famotidine 20 MG tablet Commonly known as: PEPCID Take 1 tablet (20 mg total) by mouth 2 (two) times daily.   ondansetron 8 MG disintegrating tablet Commonly known as: ZOFRAN-ODT Take 1 tablet (8 mg total) by mouth every 8 (eight) hours as needed for nausea or vomiting.   PrePLUS 27-1 MG Tabs Take 1 tablet by mouth daily.

## 2022-10-06 NOTE — MAU Note (Signed)
.  Sonya Dunn is a 22 y.o. at [redacted]w[redacted]d here in MAU reporting decreased FM today. Felt baby move about 1100 and then not again until tonight about 1800. Has not felt movement since 1800. Denies VB or LOF or no pain besides everyday pains of pregnancy  Onset of complaint: today Pain score: 0 Vitals:   10/06/22 2022 10/06/22 2025  BP:  113/69  Pulse: 99   Resp: 17   Temp: 98.5 F (36.9 C)   SpO2: 100%      FHT:145 Lab orders placed from triage:  none

## 2022-10-07 ENCOUNTER — Encounter: Payer: Self-pay | Admitting: Obstetrics

## 2022-10-08 ENCOUNTER — Ambulatory Visit (INDEPENDENT_AMBULATORY_CARE_PROVIDER_SITE_OTHER): Payer: Medicaid Other | Admitting: Obstetrics

## 2022-10-08 ENCOUNTER — Other Ambulatory Visit (HOSPITAL_COMMUNITY)
Admission: RE | Admit: 2022-10-08 | Discharge: 2022-10-08 | Disposition: A | Discharge status: Home / Self Care | Payer: Medicaid Other | Source: Ambulatory Visit | Attending: Obstetrics | Admitting: Obstetrics

## 2022-10-08 ENCOUNTER — Encounter: Payer: Self-pay | Admitting: Obstetrics

## 2022-10-08 VITALS — BP 118/73 | HR 94 | Wt 201.6 lb

## 2022-10-08 DIAGNOSIS — Z3480 Encounter for supervision of other normal pregnancy, unspecified trimester: Secondary | ICD-10-CM | POA: Diagnosis present

## 2022-10-08 NOTE — Progress Notes (Signed)
Subjective:  Sonya Dunn is a 22 y.o. G2P1001 at [redacted]w[redacted]d being seen today for ongoing prenatal care.  She is currently monitored for the following issues for this low-risk pregnancy and has History of pre-eclampsia; Supervision of other normal pregnancy, antepartum; Alpha thalassemia silent carrier; Maternal obesity affecting pregnancy, antepartum; Anemia affecting pregnancy in third trimester; and Sciatica on their problem list.  Patient reports nausea and vomiting.  Contractions: Not present. Vag. Bleeding: None.  Movement: Present. Denies leaking of fluid.   The following portions of the patient's history were reviewed and updated as appropriate: allergies, current medications, past family history, past medical history, past social history, past surgical history and problem list. Problem list updated.  Objective:   Vitals:   10/08/22 1050  BP: 118/73  Pulse: 94  Weight: 201 lb 9.6 oz (91.4 kg)    Fetal Status: Fetal Heart Rate (bpm): 143   Movement: Present     General:  Alert, oriented and cooperative. Patient is in no acute distress.  Skin: Skin is warm and dry. No rash noted.   Cardiovascular: Normal heart rate noted  Respiratory: Normal respiratory effort, no problems with respiration noted  Abdomen: Soft, gravid, appropriate for gestational age. Pain/Pressure: Present     Pelvic:  Cervical exam deferred        Extremities: Normal range of motion.  Edema: None  Mental Status: Normal mood and affect. Normal behavior. Normal judgment and thought content.   Urinalysis:      Assessment and Plan:  Pregnancy: G2P1001 at [redacted]w[redacted]d  1. Supervision of other normal pregnancy, antepartum Rx: - Cervicovaginal ancillary only( [place]) - Strep Gp B NAA    Term labor symptoms and general obstetric precautions including but not limited to vaginal bleeding, contractions, leaking of fluid and fetal movement were reviewed in detail with the patient. Please refer to After Visit  Summary for other counseling recommendations.   Return in about 1 week (around 10/15/2022) for ROB.   Brock Bad, MD 10/08/2022 11:06 AM

## 2022-10-08 NOTE — Progress Notes (Signed)
Pt reports fetal movement with some pressure. 

## 2022-10-09 LAB — CERVICOVAGINAL ANCILLARY ONLY
Bacterial Vaginitis (gardnerella): POSITIVE — AB
Candida Glabrata: NEGATIVE
Candida Vaginitis: POSITIVE — AB
Chlamydia: NEGATIVE
Comment: NEGATIVE
Comment: NEGATIVE
Comment: NEGATIVE
Comment: NEGATIVE
Comment: NEGATIVE
Comment: NORMAL
Neisseria Gonorrhea: NEGATIVE
Trichomonas: NEGATIVE

## 2022-10-10 ENCOUNTER — Other Ambulatory Visit: Payer: Self-pay | Admitting: Obstetrics

## 2022-10-10 ENCOUNTER — Other Ambulatory Visit: Payer: Self-pay

## 2022-10-10 ENCOUNTER — Encounter (HOSPITAL_COMMUNITY): Payer: Self-pay | Admitting: Obstetrics & Gynecology

## 2022-10-10 ENCOUNTER — Inpatient Hospital Stay (HOSPITAL_COMMUNITY)
Admission: AD | Admit: 2022-10-10 | Discharge: 2022-10-10 | Disposition: A | Discharge status: Home / Self Care | Payer: Medicaid Other | Attending: Obstetrics & Gynecology | Admitting: Obstetrics & Gynecology

## 2022-10-10 DIAGNOSIS — O26893 Other specified pregnancy related conditions, third trimester: Secondary | ICD-10-CM | POA: Insufficient documentation

## 2022-10-10 DIAGNOSIS — H538 Other visual disturbances: Secondary | ICD-10-CM | POA: Diagnosis not present

## 2022-10-10 DIAGNOSIS — R519 Headache, unspecified: Secondary | ICD-10-CM

## 2022-10-10 DIAGNOSIS — O99013 Anemia complicating pregnancy, third trimester: Secondary | ICD-10-CM

## 2022-10-10 DIAGNOSIS — Z3A36 36 weeks gestation of pregnancy: Secondary | ICD-10-CM | POA: Diagnosis not present

## 2022-10-10 DIAGNOSIS — B9689 Other specified bacterial agents as the cause of diseases classified elsewhere: Secondary | ICD-10-CM

## 2022-10-10 DIAGNOSIS — B379 Candidiasis, unspecified: Secondary | ICD-10-CM

## 2022-10-10 LAB — PROTEIN / CREATININE RATIO, URINE
Creatinine, Urine: 51 mg/dL
Protein Creatinine Ratio: 0.12 mg/mg{Cre} (ref 0.00–0.15)
Total Protein, Urine: 6 mg/dL

## 2022-10-10 LAB — COMPREHENSIVE METABOLIC PANEL
ALT: 9 U/L (ref 0–44)
AST: 14 U/L — ABNORMAL LOW (ref 15–41)
Albumin: 2.6 g/dL — ABNORMAL LOW (ref 3.5–5.0)
Alkaline Phosphatase: 119 U/L (ref 38–126)
Anion gap: 11 (ref 5–15)
BUN: <5 mg/dL — ABNORMAL LOW (ref 6–20)
CO2: 20 mmol/L — ABNORMAL LOW (ref 22–32)
Calcium: 8.9 mg/dL (ref 8.9–10.3)
Chloride: 104 mmol/L (ref 98–111)
Creatinine, Ser: 0.41 mg/dL — ABNORMAL LOW (ref 0.44–1.00)
GFR, Estimated: >60 mL/min (ref >60)
Glucose, Bld: 70 mg/dL (ref 70–99)
Potassium: 3.4 mmol/L — ABNORMAL LOW (ref 3.5–5.1)
Sodium: 135 mmol/L (ref 135–145)
Total Bilirubin: 0.5 mg/dL (ref 0.3–1.2)
Total Protein: 6.2 g/dL — ABNORMAL LOW (ref 6.5–8.1)

## 2022-10-10 LAB — CBC
HCT: 26.7 % — ABNORMAL LOW (ref 36.0–46.0)
Hemoglobin: 8.5 g/dL — ABNORMAL LOW (ref 12.0–15.0)
MCH: 26.9 pg (ref 26.0–34.0)
MCHC: 31.8 g/dL (ref 30.0–36.0)
MCV: 84.5 fL (ref 80.0–100.0)
Platelets: 189 10*3/uL (ref 150–400)
RBC: 3.16 MIL/uL — ABNORMAL LOW (ref 3.87–5.11)
RDW: 13.1 % (ref 11.5–15.5)
WBC: 8.4 10*3/uL (ref 4.0–10.5)
nRBC: 0 % (ref 0.0–0.2)

## 2022-10-10 LAB — STREP GP B NAA: Strep Gp B NAA: POSITIVE — AB

## 2022-10-10 LAB — URINALYSIS, ROUTINE W REFLEX MICROSCOPIC
Bilirubin Urine: NEGATIVE
Glucose, UA: NEGATIVE mg/dL
Hgb urine dipstick: NEGATIVE
Ketones, ur: NEGATIVE mg/dL
Nitrite: NEGATIVE
Protein, ur: NEGATIVE mg/dL
Specific Gravity, Urine: 1.015 (ref 1.005–1.030)
pH: 7 (ref 5.0–8.0)

## 2022-10-10 LAB — URINALYSIS, MICROSCOPIC (REFLEX): RBC / HPF: NONE SEEN RBC/hpf (ref 0–5)

## 2022-10-10 MED ORDER — TERCONAZOLE 0.8 % VA CREA
1.0000 | TOPICAL_CREAM | Freq: Every day | VAGINAL | 0 refills | Status: DC
Start: 2022-10-10 — End: 2022-10-26

## 2022-10-10 MED ORDER — ACETAMINOPHEN-CAFFEINE 500-65 MG PO TABS
2.0000 | ORAL_TABLET | Freq: Once | ORAL | Status: AC
  Administered 2022-10-10: 2 via ORAL
  Filled 2022-10-10: qty 2

## 2022-10-10 MED ORDER — METRONIDAZOLE 500 MG PO TABS
500.0000 mg | ORAL_TABLET | Freq: Two times a day (BID) | ORAL | 2 refills | Status: DC
Start: 2022-10-10 — End: 2023-09-22

## 2022-10-10 MED ORDER — POLYSACCHARIDE IRON COMPLEX 150 MG PO CAPS: 150.0000 mg | ORAL_CAPSULE | Freq: Every day | ORAL | 3 refills | Status: DC

## 2022-10-10 NOTE — MAU Provider Note (Signed)
History     CSN: [id number]  Arrival date and time: 10/10/22 1324   None     Chief Complaint  Patient presents with   BP Evaluation   HPI Sonya Dunn is a 22 y.o. G2P1001 at [redacted]w[redacted]d who presents to MAU for BP evaluation. She reports she was checking her BP between 10am and 1pm and had some elevated BPs. Highest BPs were 144/88 and 139/92. She reports a headache started this morning as well as blurry vision in her left eye. She reports a history of migraines and was previously on medication however does not remember the name of it. This headache feels different compared to migraines. She also has swelling in her face, hands, and neck. No RUQ/epigastric pain. Has a history of pre-eclampsia with first pregnancy. No contractions, vaginal bleeding, or leaking fluid. Reports normal fetal movement.   Receives [facility] [place], next appointment is on Thursday.  OB History     Gravida  2   Para  1   Term  1   Preterm      AB      Living  1      SAB      IAB      Ectopic      Multiple  0   Live Births  1           Past Medical History:  Diagnosis Date   Anxiety    Chlamydia    Depression    Geographical tongue    Headache    Pregnancy induced hypertension    Seasonal allergies     Past Surgical History:  Procedure Laterality Date   EYE SURGERY      Family History  Problem Relation Age of Onset   Healthy Mother    Healthy Father    Hypertension Paternal Grandfather     Social History   Tobacco Use   Smoking status: Never   Smokeless tobacco: Never  Vaping Use   Vaping Use: Never used  Substance Use Topics   Alcohol use: Never   Drug use: Not Currently    Types: Marijuana    Comment: more than 30days    Allergies:  Allergies  Allergen Reactions   Other     Seasonal    No medications prior to admission.   Review of Systems  Eyes:  Positive for visual disturbance.  Musculoskeletal:        Swelling   Neurological:  Positive for  headaches.  All other systems reviewed and are negative.  Physical Exam  Patient Vitals for the past 24 hrs:  BP Temp Temp src Pulse Resp SpO2 Height Weight  10/10/22 1430 117/72 -- -- 92 -- 99 % -- --  10/10/22 1415 117/85 -- -- 89 -- -- -- --  10/10/22 1400 115/77 -- -- 91 -- -- -- --  10/10/22 1353 113/77 -- -- 80 17 -- -- --  10/10/22 1337 124/79 98.8 F (37.1 C) Oral 95 17 99 % -- --  10/10/22 1331 -- -- -- -- -- -- 5\' 2"  (1.575 m) 93.8 kg   Physical Exam Vitals and nursing note reviewed.  Constitutional:      General: She is not in acute distress. Eyes:     Extraocular Movements: Extraocular movements intact.     Pupils: Pupils are equal, round, and reactive to light.  Cardiovascular:     Rate and Rhythm: Normal rate.  Pulmonary:     Effort: Pulmonary effort is normal.  No respiratory distress.  Abdominal:     Comments: Gravid   Musculoskeletal:        General: Normal range of motion.     Cervical back: Normal range of motion.     Right lower leg: No edema.     Left lower leg: No edema.  Skin:    General: Skin is warm and dry.  Neurological:     General: No focal deficit present.     Mental Status: She is alert and oriented to person, place, and time.     Cranial Nerves: No cranial nerve deficit.  Psychiatric:        Mood and Affect: Mood normal.        Behavior: Behavior normal.    NST FHR: 140bpm, moderate variability, +15x15 accels, no decels Toco: irregular  Results for orders placed or performed during the hospital encounter of 10/10/22 (from the past 24 hour(s))  Urinalysis, Routine w reflex microscopic -Urine, Clean Catch     Status: Abnormal   Collection Time: 10/10/22  1:47 PM  Result Value Ref Range   Color, Urine YELLOW YELLOW   APPearance CLEAR CLEAR   Specific Gravity, Urine 1.015 1.005 - 1.030   pH 7.0 5.0 - 8.0   Glucose, UA NEGATIVE NEGATIVE mg/dL   Hgb urine dipstick NEGATIVE NEGATIVE   Bilirubin Urine NEGATIVE NEGATIVE   Ketones, ur  NEGATIVE NEGATIVE mg/dL   Protein, ur NEGATIVE NEGATIVE mg/dL   Nitrite NEGATIVE NEGATIVE   Leukocytes,Ua SMALL (A) NEGATIVE  Urinalysis, Microscopic (reflex)     Status: Abnormal   Collection Time: 10/10/22  1:47 PM  Result Value Ref Range   RBC / HPF NONE SEEN 0 - 5 RBC/hpf   WBC, UA 0-5 0 - 5 WBC/hpf   Bacteria, UA RARE (A) NONE SEEN   Squamous Epithelial / HPF 0-5 0 - 5 /HPF  Protein / creatinine ratio, urine     Status: None   Collection Time: 10/10/22  1:54 PM  Result Value Ref Range   Creatinine, Urine 51 mg/dL   Total Protein, Urine 6 mg/dL   Protein Creatinine Ratio 0.12 0.00 - 0.15 mg/mg[Cre]  CBC     Status: Abnormal   Collection Time: 10/10/22  2:12 PM  Result Value Ref Range   WBC 8.4 4.0 - 10.5 K/uL   RBC 3.16 (L) 3.87 - 5.11 MIL/uL   Hemoglobin 8.5 (L) 12.0 - 15.0 g/dL   HCT 16.1 (L) 09.6 - 04.5 %   MCV 84.5 80.0 - 100.0 fL   MCH 26.9 26.0 - 34.0 pg   MCHC 31.8 30.0 - 36.0 g/dL   RDW 40.9 81.1 - 91.4 %   Platelets 189 150 - 400 K/uL   nRBC 0.0 0.0 - 0.2 %  Comprehensive metabolic panel     Status: Abnormal   Collection Time: 10/10/22  2:12 PM  Result Value Ref Range   Sodium 135 135 - 145 mmol/L   Potassium 3.4 (L) 3.5 - 5.1 mmol/L   Chloride 104 98 - 111 mmol/L   CO2 20 (L) 22 - 32 mmol/L   Glucose, Bld 70 70 - 99 mg/dL   BUN <5 (L) 6 - 20 mg/dL   Creatinine, Ser 7.82 (L) 0.44 - 1.00 mg/dL   Calcium 8.9 8.9 - 95.6 mg/dL   Total Protein 6.2 (L) 6.5 - 8.1 g/dL   Albumin 2.6 (L) 3.5 - 5.0 g/dL   AST 14 (L) 15 - 41 U/L   ALT 9 0 -  44 U/L   Alkaline Phosphatase 119 38 - 126 U/L   Total Bilirubin 0.5 0.3 - 1.2 mg/dL   GFR, Estimated >78 >29 mL/min   Anion gap 11 5 - 15   MAU Course  Procedures  MDM UA CBC, CMP, UPCR Serial BP's NST Excedrin Tension  Serial BP's- normotensive. PEC labs wnl. Patient noted to have anemia, however has not been taking iron supplementation. Headache resolved with medication. Low suspicion for PEC at this time. NST  reactive.   Assessment and Plan   1. [redacted] weeks gestation of pregnancy   2. Headache in pregnancy, antepartum, third trimester   3. Anemia affecting pregnancy in third trimester    - Discharge home in stable condition - Rx for iron supplement sent to pharmacy - Return precautions given - Keep OB appointment as scheduled   Brand Males, CNM 10/10/2022, 3:17 PM

## 2022-10-10 NOTE — MAU Note (Signed)
Sonya Dunn is a 22 y.o. at [redacted]w[redacted]d here in MAU reporting: she's here for BP evaluation, has been checking BP @ home since 1000 this morning secondary high risk for Pre eclampsia, last BP taken @ home was 144/88.  Reports has blurred vision in left eye and HA, denies epigastric pain. Also reports swelling in hands, neck and face.  Denies VB or LOF.  Endorses +FM. LMP: NA Onset of complaint: today Pain score: 3 There were no vitals filed for this visit.   FHT:148 bpm Lab orders placed from triage:   UA

## 2022-10-15 ENCOUNTER — Encounter: Payer: Self-pay | Admitting: Obstetrics

## 2022-10-15 ENCOUNTER — Ambulatory Visit (INDEPENDENT_AMBULATORY_CARE_PROVIDER_SITE_OTHER): Payer: Medicaid Other | Admitting: Obstetrics & Gynecology

## 2022-10-15 VITALS — BP 109/74 | HR 89 | Wt 209.0 lb

## 2022-10-15 DIAGNOSIS — K117 Disturbances of salivary secretion: Secondary | ICD-10-CM

## 2022-10-15 DIAGNOSIS — Z3480 Encounter for supervision of other normal pregnancy, unspecified trimester: Secondary | ICD-10-CM

## 2022-10-15 MED ORDER — METOCLOPRAMIDE HCL 5 MG PO TABS
5.0000 mg | ORAL_TABLET | Freq: Three times a day (TID) | ORAL | 1 refills | Status: DC
Start: 2022-10-15 — End: 2023-09-22

## 2022-10-15 NOTE — Progress Notes (Signed)
Pt complains of N&V, back/pelvic pain and problems sleeping.

## 2022-10-15 NOTE — Progress Notes (Signed)
   PRENATAL VISIT NOTE  Subjective:  Sonya Dunn is a 22 y.o. G2P1001 at [redacted]w[redacted]d being seen today for ongoing prenatal care.  She is currently monitored for the following issues for this high-risk pregnancy and has History of pre-eclampsia; Supervision of other normal pregnancy, antepartum; Alpha thalassemia silent carrier; Maternal obesity affecting pregnancy, antepartum; Anemia affecting pregnancy in third trimester; and Sciatica on their problem list.  Patient reports nausea, vomiting, and spitting .  Contractions: Irregular. Vag. Bleeding: None.  Movement: Present. Denies leaking of fluid.   The following portions of the patient's history were reviewed and updated as appropriate: allergies, current medications, past family history, past medical history, past social history, past surgical history and problem list.   Objective:   Vitals:   10/15/22 0956  BP: 109/74  Pulse: 89  Weight: 209 lb (94.8 kg)    Fetal Status: Fetal Heart Rate (bpm): 150   Movement: Present     General:  Alert, oriented and cooperative. Patient is in no acute distress.  Skin: Skin is warm and dry. No rash noted.   Cardiovascular: Normal heart rate noted  Respiratory: Normal respiratory effort, no problems with respiration noted  Abdomen: Soft, gravid, appropriate for gestational age.  Pain/Pressure: Present     Pelvic: Cervical exam deferred        Extremities: Normal range of motion.     Mental Status: Normal mood and affect. Normal behavior. Normal judgment and thought content.   Assessment and Plan:  Pregnancy: G2P1001 at [redacted]w[redacted]d 1. Supervision of other normal pregnancy, antepartum N&V persists, will add Reglan. Denies heartburn - metoCLOPramide (REGLAN) 5 MG tablet; Take 1 tablet (5 mg total) by mouth 4 (four) times daily -  before meals and at bedtime.  Dispense: 60 tablet; Refill: 1  2. Ptyalism Spitting daily - metoCLOPramide (REGLAN) 5 MG tablet; Take 1 tablet (5 mg total) by mouth 4 (four)  times daily -  before meals and at bedtime.  Dispense: 60 tablet; Refill: 1  Term labor symptoms and general obstetric precautions including but not limited to vaginal bleeding, contractions, leaking of fluid and fetal movement were reviewed in detail with the patient. Please refer to After Visit Summary for other counseling recommendations.   Return in about 1 week (around 10/22/2022).  Future Appointments  Date Time Provider Department Center  10/22/2022 10:35 AM Corlis Hove, NP [facility] None  10/23/2022  9:15 AM [facility] None  10/29/2022 10:35 AM Nobie Putnam, Cyndi Lennert, MD [facility] None  11/05/2022  9:55 AM Conan Bowens, MD [facility] None  11/05/2022 11:00 AM Georganna Skeans, MD [facility] None  11/06/2022  9:00 AM [facility] None    Scheryl Darter, MD

## 2022-10-22 ENCOUNTER — Encounter: Payer: Self-pay | Admitting: Student

## 2022-10-22 ENCOUNTER — Ambulatory Visit (INDEPENDENT_AMBULATORY_CARE_PROVIDER_SITE_OTHER): Payer: Medicaid Other | Admitting: Student

## 2022-10-22 VITALS — BP 131/89 | HR 66 | Wt 206.4 lb

## 2022-10-22 DIAGNOSIS — O99013 Anemia complicating pregnancy, third trimester: Secondary | ICD-10-CM

## 2022-10-22 DIAGNOSIS — Z3480 Encounter for supervision of other normal pregnancy, unspecified trimester: Secondary | ICD-10-CM

## 2022-10-22 DIAGNOSIS — Z3A38 38 weeks gestation of pregnancy: Secondary | ICD-10-CM

## 2022-10-22 NOTE — Progress Notes (Signed)
   PRENATAL VISIT NOTE  Subjective:  Sonya Dunn is a 22 y.o. G2P1001 at [redacted]w[redacted]d being seen today for ongoing prenatal care.  She is currently monitored for the following issues for this low-risk pregnancy and has History of pre-eclampsia; Supervision of other normal pregnancy, antepartum; Alpha thalassemia silent carrier; Maternal obesity affecting pregnancy, antepartum; Anemia affecting pregnancy in third trimester; Sciatica; Normal labor; and Elevated blood-pressure reading without diagnosis of hypertension on their problem list.  Patient reports nausea and actively spitting in office. Declined initiation of Reglan. Contractions: Irritability. Vag. Bleeding: None.  Movement: Present. Denies leaking of fluid.   The following portions of the patient's history were reviewed and updated as appropriate: allergies, current medications, past family history, past medical history, past social history, past surgical history and problem list.   Objective:   Vitals:   10/22/22 1046 10/22/22 1051  BP: (!) 127/93 131/89  Pulse: 72 66  Weight: 206 lb 6.4 oz (93.6 kg)     Fetal Status: Fetal Heart Rate (bpm): 135   Movement: Present     General:  Alert, oriented and cooperative. Patient is in no acute distress.  Skin: Skin is warm and dry. No rash noted.   Cardiovascular: Normal heart rate noted  Respiratory: Normal respiratory effort, no problems with respiration noted  Abdomen: Soft, gravid, appropriate for gestational age.  Pain/Pressure: Present     Pelvic: Cervical exam performed in the presence of a chaperone Dilation: Fingertip Effacement (%): 10, 20 Station: -3  Extremities: Normal range of motion.  Edema: Trace  Mental Status: Normal mood and affect. Normal behavior. Normal judgment and thought content.   Assessment and Plan:  Pregnancy: G2P1001 at [redacted]w[redacted]d 1. Supervision of other normal pregnancy, antepartum - vigorous and frequent fetal movement  2. [redacted] weeks gestation of  pregnancy - encouraged natural methods to onset labor - continue routine care until 40 weeks  3. Anemia affecting pregnancy in third trimester - iv iron infusion tomorrow   Term labor symptoms and general obstetric precautions including but not limited to vaginal bleeding, contractions, leaking of fluid and fetal movement were reviewed in detail with the patient. Please refer to After Visit Summary for other counseling recommendations.   No follow-ups on file.  Future Appointments  Date Time Provider Department Center  10/29/2022 10:35 AM Nobie Putnam, Cyndi Lennert, MD [facility] None  11/05/2022  9:55 AM Conan Bowens, MD [facility] None  11/05/2022 11:00 AM Georganna Skeans, MD [facility] None  11/06/2022  9:00 AM [facility] None    Corlis Hove, NP

## 2022-10-22 NOTE — Progress Notes (Signed)
Pt presents for ROB visit. Pt seen at MAU on 10-10-22 for BP. Requesting cervical check.

## 2022-10-23 ENCOUNTER — Ambulatory Visit: Payer: Medicaid Other

## 2022-10-23 MED ORDER — IRON SUCROSE 500 MG IVPB - SIMPLE MED
500.0000 mg | Freq: Once | INTRAVENOUS | Status: DC
  Filled 2022-10-23: qty 275

## 2022-10-23 MED ORDER — DIPHENHYDRAMINE HCL 25 MG PO CAPS: 25.0000 mg | ORAL_CAPSULE | Freq: Once | ORAL | Status: AC

## 2022-10-23 MED ORDER — ACETAMINOPHEN 325 MG PO TABS: 650.0000 mg | ORAL_TABLET | Freq: Once | ORAL | Status: AC

## 2022-10-26 ENCOUNTER — Encounter (HOSPITAL_BASED_OUTPATIENT_CLINIC_OR_DEPARTMENT_OTHER): Payer: Self-pay | Admitting: Anesthesiology

## 2022-10-26 ENCOUNTER — Inpatient Hospital Stay (HOSPITAL_BASED_OUTPATIENT_CLINIC_OR_DEPARTMENT_OTHER): Payer: Self-pay | Admitting: Anesthesiology

## 2022-10-26 ENCOUNTER — Encounter (HOSPITAL_COMMUNITY): Payer: Self-pay | Admitting: Family Medicine

## 2022-10-26 ENCOUNTER — Other Ambulatory Visit: Payer: Self-pay

## 2022-10-26 ENCOUNTER — Telehealth: Payer: Self-pay | Admitting: Family Medicine

## 2022-10-26 ENCOUNTER — Inpatient Hospital Stay (HOSPITAL_COMMUNITY)
Admission: AD | Admit: 2022-10-26 | Discharge: 2022-10-28 | DRG: 807 | Disposition: A | Discharge status: Home / Self Care | Payer: Medicaid Other | Attending: Obstetrics & Gynecology | Admitting: Obstetrics & Gynecology

## 2022-10-26 DIAGNOSIS — Z3A39 39 weeks gestation of pregnancy: Secondary | ICD-10-CM

## 2022-10-26 DIAGNOSIS — D509 Iron deficiency anemia, unspecified: Secondary | ICD-10-CM | POA: Diagnosis present

## 2022-10-26 DIAGNOSIS — O99824 Streptococcus B carrier state complicating childbirth: Secondary | ICD-10-CM | POA: Diagnosis present

## 2022-10-26 DIAGNOSIS — O9902 Anemia complicating childbirth: Secondary | ICD-10-CM | POA: Diagnosis present

## 2022-10-26 DIAGNOSIS — Z148 Genetic carrier of other disease: Secondary | ICD-10-CM | POA: Diagnosis not present

## 2022-10-26 DIAGNOSIS — O26893 Other specified pregnancy related conditions, third trimester: Secondary | ICD-10-CM | POA: Diagnosis present

## 2022-10-26 DIAGNOSIS — Z8759 Personal history of other complications of pregnancy, childbirth and the puerperium: Secondary | ICD-10-CM

## 2022-10-26 DIAGNOSIS — O134 Gestational [pregnancy-induced] hypertension without significant proteinuria, complicating childbirth: Secondary | ICD-10-CM

## 2022-10-26 DIAGNOSIS — R030 Elevated blood-pressure reading, without diagnosis of hypertension: Secondary | ICD-10-CM | POA: Diagnosis present

## 2022-10-26 DIAGNOSIS — O99214 Obesity complicating childbirth: Secondary | ICD-10-CM | POA: Diagnosis present

## 2022-10-26 DIAGNOSIS — O99820 Streptococcus B carrier state complicating pregnancy: Secondary | ICD-10-CM

## 2022-10-26 DIAGNOSIS — D563 Thalassemia minor: Secondary | ICD-10-CM | POA: Diagnosis present

## 2022-10-26 DIAGNOSIS — O99013 Anemia complicating pregnancy, third trimester: Secondary | ICD-10-CM | POA: Diagnosis present

## 2022-10-26 DIAGNOSIS — O99210 Obesity complicating pregnancy, unspecified trimester: Secondary | ICD-10-CM | POA: Diagnosis present

## 2022-10-26 DIAGNOSIS — Z3480 Encounter for supervision of other normal pregnancy, unspecified trimester: Secondary | ICD-10-CM

## 2022-10-26 LAB — CBC
HCT: 25.5 % — ABNORMAL LOW (ref 36.0–46.0)
HCT: 27.6 % — ABNORMAL LOW (ref 36.0–46.0)
Hemoglobin: 8 g/dL — ABNORMAL LOW (ref 12.0–15.0)
Hemoglobin: 8.6 g/dL — ABNORMAL LOW (ref 12.0–15.0)
MCH: 26 pg (ref 26.0–34.0)
MCH: 25.6 pg — ABNORMAL LOW (ref 26.0–34.0)
MCHC: 31.4 g/dL (ref 30.0–36.0)
MCHC: 31.2 g/dL (ref 30.0–36.0)
MCV: 82.8 fL (ref 80.0–100.0)
MCV: 82.1 fL (ref 80.0–100.0)
Platelets: 187 10*3/uL (ref 150–400)
Platelets: 181 10*3/uL (ref 150–400)
RBC: 3.08 MIL/uL — ABNORMAL LOW (ref 3.87–5.11)
RBC: 3.36 MIL/uL — ABNORMAL LOW (ref 3.87–5.11)
RDW: 14 % (ref 11.5–15.5)
RDW: 13.8 % (ref 11.5–15.5)
WBC: 8.7 10*3/uL (ref 4.0–10.5)
WBC: 12.9 10*3/uL — ABNORMAL HIGH (ref 4.0–10.5)
nRBC: 0 % (ref 0.0–0.2)
nRBC: 0 % (ref 0.0–0.2)

## 2022-10-26 LAB — PROTEIN / CREATININE RATIO, URINE
Creatinine, Urine: 99 mg/dL
Protein Creatinine Ratio: 0.19 mg/mg{Cre} — ABNORMAL HIGH (ref 0.00–0.15)
Total Protein, Urine: 19 mg/dL

## 2022-10-26 LAB — COMPREHENSIVE METABOLIC PANEL
ALT: 10 U/L (ref 0–44)
AST: 19 U/L (ref 15–41)
Albumin: 2.5 g/dL — ABNORMAL LOW (ref 3.5–5.0)
Alkaline Phosphatase: 135 U/L — ABNORMAL HIGH (ref 38–126)
Anion gap: 12 (ref 5–15)
BUN: <5 mg/dL — ABNORMAL LOW (ref 6–20)
CO2: 18 mmol/L — ABNORMAL LOW (ref 22–32)
Calcium: 8.2 mg/dL — ABNORMAL LOW (ref 8.9–10.3)
Chloride: 104 mmol/L (ref 98–111)
Creatinine, Ser: 0.48 mg/dL (ref 0.44–1.00)
GFR, Estimated: >60 mL/min (ref >60)
Glucose, Bld: 81 mg/dL (ref 70–99)
Potassium: 3.1 mmol/L — ABNORMAL LOW (ref 3.5–5.1)
Sodium: 134 mmol/L — ABNORMAL LOW (ref 135–145)
Total Bilirubin: 0.5 mg/dL (ref 0.3–1.2)
Total Protein: 5.9 g/dL — ABNORMAL LOW (ref 6.5–8.1)

## 2022-10-26 LAB — TYPE AND SCREEN
ABO/RH(D): O POS
Antibody Screen: NEGATIVE

## 2022-10-26 LAB — POCT FERN TEST: POCT Fern Test: NEGATIVE

## 2022-10-26 LAB — RPR: RPR Ser Ql: NONREACTIVE

## 2022-10-26 MED ORDER — TRANEXAMIC ACID-NACL 1000-0.7 MG/100ML-% IV SOLN
1000.0000 mg | Freq: Once | INTRAVENOUS | Status: AC | PRN
  Administered 2022-10-26: 1000 mg via INTRAVENOUS
  Filled 2022-10-26: qty 100

## 2022-10-26 MED ORDER — ONDANSETRON HCL 4 MG PO TABS: 4.0000 mg | ORAL_TABLET | ORAL | Status: DC | PRN

## 2022-10-26 MED ORDER — OXYCODONE-ACETAMINOPHEN 5-325 MG PO TABS: 2.0000 | ORAL_TABLET | ORAL | Status: DC | PRN

## 2022-10-26 MED ORDER — ACETAMINOPHEN 325 MG PO TABS
650.0000 mg | ORAL_TABLET | ORAL | Status: DC | PRN
  Administered 2022-10-27: 650 mg via ORAL
  Filled 2022-10-26: qty 2

## 2022-10-26 MED ORDER — LACTATED RINGERS IV SOLN: 500.0000 mL | Freq: Once | INTRAVENOUS | Status: DC

## 2022-10-26 MED ORDER — ACETAMINOPHEN 325 MG PO TABS: 650.0000 mg | ORAL_TABLET | ORAL | Status: DC | PRN

## 2022-10-26 MED ORDER — SENNOSIDES-DOCUSATE SODIUM 8.6-50 MG PO TABS
2.0000 | ORAL_TABLET | Freq: Every day | ORAL | Status: DC
  Administered 2022-10-28: 2 via ORAL
  Filled 2022-10-26: qty 2

## 2022-10-26 MED ORDER — OXYTOCIN-SODIUM CHLORIDE 30-0.9 UT/500ML-% IV SOLN: 2.5000 [IU]/h | INTRAVENOUS | Status: DC

## 2022-10-26 MED ORDER — EPHEDRINE 5 MG/ML INJ: 10.0000 mg | INTRAVENOUS | Status: DC | PRN

## 2022-10-26 MED ORDER — TRANEXAMIC ACID 1000 MG/10ML IV SOLN: 1000.0000 mg | Freq: Once | INTRAVENOUS | Status: DC

## 2022-10-26 MED ORDER — PHENYLEPHRINE 80 MCG/ML (10ML) SYRINGE FOR IV PUSH (FOR BLOOD PRESSURE SUPPORT): 80.0000 ug | PREFILLED_SYRINGE | INTRAVENOUS | Status: DC | PRN

## 2022-10-26 MED ORDER — TETANUS-DIPHTH-ACELL PERTUSSIS 5-2.5-18.5 LF-MCG/0.5 IM SUSY: 0.5000 mL | PREFILLED_SYRINGE | Freq: Once | INTRAMUSCULAR | Status: DC

## 2022-10-26 MED ORDER — PRENATAL MULTIVITAMIN CH
1.0000 | ORAL_TABLET | Freq: Every day | ORAL | Status: DC
  Administered 2022-10-27 – 2022-10-28 (×2): 1 via ORAL
  Filled 2022-10-26 (×2): qty 1

## 2022-10-26 MED ORDER — TRANEXAMIC ACID-NACL 1000-0.7 MG/100ML-% IV SOLN: 1000.0000 mg | Freq: Once | INTRAVENOUS | Status: DC

## 2022-10-26 MED ORDER — DIBUCAINE (PERIANAL) 1 % EX OINT: 1.0000 | TOPICAL_OINTMENT | CUTANEOUS | Status: DC | PRN

## 2022-10-26 MED ORDER — ONDANSETRON HCL 4 MG/2ML IJ SOLN
4.0000 mg | Freq: Four times a day (QID) | INTRAMUSCULAR | Status: DC | PRN
  Administered 2022-10-26 (×2): 4 mg via INTRAVENOUS
  Filled 2022-10-26 (×2): qty 2

## 2022-10-26 MED ORDER — COCONUT OIL OIL: 1.0000 | TOPICAL_OIL | Status: DC | PRN

## 2022-10-26 MED ORDER — DIPHENHYDRAMINE HCL 25 MG PO CAPS: 25.0000 mg | ORAL_CAPSULE | Freq: Four times a day (QID) | ORAL | Status: DC | PRN

## 2022-10-26 MED ORDER — DIPHENHYDRAMINE HCL 50 MG/ML IJ SOLN: 12.5000 mg | INTRAMUSCULAR | Status: DC | PRN

## 2022-10-26 MED ORDER — LIDOCAINE HCL (PF) 1 % IJ SOLN
INTRAMUSCULAR | Status: DC | PRN
  Administered 2022-10-26 (×2): 5 mL via EPIDURAL

## 2022-10-26 MED ORDER — FENTANYL-BUPIVACAINE-NACL 0.5-0.125-0.9 MG/250ML-% EP SOLN
12.0000 mL/h | EPIDURAL | Status: DC | PRN
  Administered 2022-10-26: 12 mL/h via EPIDURAL
  Filled 2022-10-26: qty 250

## 2022-10-26 MED ORDER — FENTANYL CITRATE (PF) 100 MCG/2ML IJ SOLN
100.0000 ug | INTRAMUSCULAR | Status: DC | PRN
  Administered 2022-10-26: 100 ug via INTRAVENOUS
  Filled 2022-10-26: qty 2

## 2022-10-26 MED ORDER — LACTATED RINGERS IV SOLN
500.0000 mL | INTRAVENOUS | Status: DC | PRN
  Administered 2022-10-26 (×3): 500 mL via INTRAVENOUS

## 2022-10-26 MED ORDER — LACTATED RINGERS IV SOLN: INTRAVENOUS | Status: DC

## 2022-10-26 MED ORDER — SIMETHICONE 80 MG PO CHEW: 80.0000 mg | CHEWABLE_TABLET | ORAL | Status: DC | PRN

## 2022-10-26 MED ORDER — FLEET ENEMA 7-19 GM/118ML RE ENEM: 1.0000 | ENEMA | RECTAL | Status: DC | PRN

## 2022-10-26 MED ORDER — WITCH HAZEL-GLYCERIN EX PADS: 1.0000 | MEDICATED_PAD | CUTANEOUS | Status: DC | PRN

## 2022-10-26 MED ORDER — IBUPROFEN 600 MG PO TABS
600.0000 mg | ORAL_TABLET | Freq: Four times a day (QID) | ORAL | Status: DC
  Administered 2022-10-27 – 2022-10-28 (×7): 600 mg via ORAL
  Filled 2022-10-26 (×7): qty 1

## 2022-10-26 MED ORDER — ONDANSETRON HCL 4 MG/2ML IJ SOLN: 4.0000 mg | INTRAMUSCULAR | Status: DC | PRN

## 2022-10-26 MED ORDER — OXYTOCIN BOLUS FROM INFUSION
333.0000 mL | Freq: Once | INTRAVENOUS | Status: AC
  Administered 2022-10-26: 333 mL via INTRAVENOUS

## 2022-10-26 MED ORDER — SODIUM CHLORIDE 0.9 % IV SOLN
5.0000 10*6.[IU] | Freq: Once | INTRAVENOUS | Status: AC
  Administered 2022-10-26: 5 10*6.[IU] via INTRAVENOUS
  Filled 2022-10-26: qty 5

## 2022-10-26 MED ORDER — SOD CITRATE-CITRIC ACID 500-334 MG/5ML PO SOLN: 30.0000 mL | ORAL | Status: DC | PRN

## 2022-10-26 MED ORDER — PENICILLIN G POT IN DEXTROSE 60000 UNIT/ML IV SOLN
3.0000 10*6.[IU] | INTRAVENOUS | Status: DC
  Administered 2022-10-26 (×3): 3 10*6.[IU] via INTRAVENOUS
  Filled 2022-10-26 (×3): qty 50

## 2022-10-26 MED ORDER — LIDOCAINE HCL (PF) 1 % IJ SOLN: 30.0000 mL | INTRAMUSCULAR | Status: DC | PRN

## 2022-10-26 MED ORDER — OXYTOCIN-SODIUM CHLORIDE 30-0.9 UT/500ML-% IV SOLN
1.0000 m[IU]/min | INTRAVENOUS | Status: DC
  Administered 2022-10-26: 2 m[IU]/min via INTRAVENOUS
  Filled 2022-10-26: qty 500

## 2022-10-26 MED ORDER — TERBUTALINE SULFATE 1 MG/ML IJ SOLN: 0.2500 mg | Freq: Once | INTRAMUSCULAR | Status: DC | PRN

## 2022-10-26 MED ORDER — OXYCODONE-ACETAMINOPHEN 5-325 MG PO TABS: 1.0000 | ORAL_TABLET | ORAL | Status: DC | PRN

## 2022-10-26 MED ORDER — ZOLPIDEM TARTRATE 5 MG PO TABS: 5.0000 mg | ORAL_TABLET | Freq: Every evening | ORAL | Status: DC | PRN

## 2022-10-26 MED ORDER — TERBUTALINE SULFATE 1 MG/ML IJ SOLN
INTRAMUSCULAR | Status: AC
  Filled 2022-10-26: qty 1

## 2022-10-26 MED ORDER — BENZOCAINE-MENTHOL 20-0.5 % EX AERO: 1.0000 | INHALATION_SPRAY | CUTANEOUS | Status: DC | PRN

## 2022-10-26 MED ORDER — PHENYLEPHRINE 80 MCG/ML (10ML) SYRINGE FOR IV PUSH (FOR BLOOD PRESSURE SUPPORT)
80.0000 ug | PREFILLED_SYRINGE | INTRAVENOUS | Status: DC | PRN
  Filled 2022-10-26: qty 10

## 2022-10-26 NOTE — Progress Notes (Signed)
Bedside fetal presentation scan conducted by [facility]. Fetus vertex.

## 2022-10-26 NOTE — Discharge Summary (Shared)
Postpartum Discharge Summary  Date of Service updated***     Patient Name: Sonya Dunn DOB: 10-13-2000 MRN: [id number]  Date of admission: 10/26/2022 Delivery date:10/26/2022  Delivering provider: Rolm Bookbinder  Date of discharge: 10/26/2022  Admitting diagnosis: Normal labor [O80, Z37.9] Intrauterine pregnancy: [redacted]w[redacted]d     Secondary diagnosis:  Active Problems:   History of pre-eclampsia   Alpha thalassemia silent carrier   Anemia affecting pregnancy in third trimester   Elevated blood-pressure reading without diagnosis of hypertension  Additional problems: ***    Discharge diagnosis: Term Pregnancy Delivered and Gestational Hypertension                                              Post partum procedures:{Postpartum procedures:23558} Augmentation: AROM and Pitocin Complications: None  Hospital course: Induction of Labor With Vaginal Delivery   22 y.o. yo G2P1001 at [redacted]w[redacted]d was admitted to the hospital 10/26/2022 for induction of labor.  Indication for induction: Gestational hypertension.  Patient had an labor course complicated by n/a Membrane Rupture Time/Date: 1:43 PM ,10/26/2022   Delivery Method:Vaginal, Spontaneous  Episiotomy: None  Lacerations:  None  Details of delivery can be found in separate delivery note.  Patient had a postpartum course complicated by***. Patient is discharged home 10/26/22.  Newborn Data: Birth date:10/26/2022  Birth time:9:08 PM  Gender:Female  Living status:Living  Apgars: ,  Weight:   Magnesium Sulfate received: No BMZ received: No Rhophylac:N/A MMR:N/A T-DaP: declined Flu: No Transfusion:{Transfusion received:30440034}  Physical exam  Vitals:   10/26/22 1901 10/26/22 1920 10/26/22 1930 10/26/22 2000  BP: 124/77 121/70 130/80 119/75  Pulse: 74 79 71 68  Resp:  16    Temp:  98.6 F (37 C)    TempSrc:  Oral    SpO2:      Weight:      Height:       General: {Exam; general:21111117} Lochia: {Desc;  appropriate/inappropriate:30686::"appropriate"} Uterine Fundus: {Desc; firm/soft:30687} Incision: {Exam; incision:21111123} DVT Evaluation: {Exam; dvt:2111122} Labs: Lab Results  Component Value Date   WBC 12.9 (H) 10/26/2022   HGB 8.6 (L) 10/26/2022   HCT 27.6 (L) 10/26/2022   MCV 82.1 10/26/2022   PLT 181 10/26/2022      Latest Ref Rng & Units 10/26/2022    5:31 AM  CMP  Glucose 70 - 99 mg/dL 81   BUN 6 - 20 mg/dL <5   Creatinine 2.95 - 1.00 mg/dL 6.21   Sodium 308 - 657 mmol/L 134   Potassium 3.5 - 5.1 mmol/L 3.1   Chloride 98 - 111 mmol/L 104   CO2 22 - 32 mmol/L 18   Calcium 8.9 - 10.3 mg/dL 8.2   Total Protein 6.5 - 8.1 g/dL 5.9   Total Bilirubin 0.3 - 1.2 mg/dL 0.5   Alkaline Phos 38 - 126 U/L 135   AST 15 - 41 U/L 19   ALT 0 - 44 U/L 10    Edinburgh Score:    12/09/2018    8:54 AM  Edinburgh Postnatal Depression Scale Screening Tool  I have been able to laugh and see the funny side of things. 0  I have looked forward with enjoyment to things. 0  I have blamed myself unnecessarily when things went wrong. 0  I have been anxious or worried for no good reason. 1  I have felt scared or panicky  for no good reason. 2  Things have been getting on top of me. 0  I have been so unhappy that I have had difficulty sleeping. 0  I have felt sad or miserable. 0  I have been so unhappy that I have been crying. 0  The thought of harming myself has occurred to me. 0  Edinburgh Postnatal Depression Scale Total 3     After visit meds:  Allergies as of 10/26/2022   No Known Allergies   Med Rec must be completed prior to using this [facility]***        Discharge home in stable condition Infant Feeding: Bottle and Breast Infant Disposition:home with mother Discharge instruction: per After Visit Summary and Postpartum booklet. Activity: Advance as tolerated. Pelvic rest for 6 weeks.  Diet: routine diet Future Appointments: Future Appointments  Date Time Provider  Department Center  10/29/2022 10:35 AM Celedonio Savage, MD [facility] None  11/05/2022  9:55 AM Conan Bowens, MD [facility] None  11/05/2022 11:00 AM Georganna Skeans, MD [facility] None  11/06/2022  9:00 AM [facility] None   Follow up Visit: Please schedule this patient for Postpartum visit in: 4 weeks with the following provider: Any provider In-Person For C/S patients schedule nurse incision check in weeks 2 weeks: no Low risk pregnancy complicated by: HTN Delivery mode:  SVD Anticipated Birth Control:  POPs PP Procedures needed: BP check     10/26/2022 Rolm Bookbinder, CNM

## 2022-10-26 NOTE — Lactation Note (Signed)
This note was copied from a baby's chart. Lactation Consultation Note  Patient Name: Sonya Dunn [id number] Date: 10/26/2022 Age:22 hours Reason for consult: L&D Initial assessment;Term.  P2, term female infant. Birth Parent latched infant on her left breast using the football hold position, infant latched with depth and sustained latch, was still breastfeeding after 15 minutes when LC left the room. Birth Parent will continue to breastfeed infant by cues, on demand, 8 to 12+ times within 24 hours, skin to skin. Birth Parent knows to call RN/ LC if further latch assistance is needed on MBU.   Maternal Data Does the patient have breastfeeding experience prior to this delivery?: Yes How long did the patient breastfeed?: Per Birth Parent, she breastfeed her 1st child for 2 months, he is currently 30 years old.  Feeding Mother's Current Feeding Choice: Breast Milk  LATCH Score Latch: Grasps breast easily, tongue down, lips flanged, rhythmical sucking.  Audible Swallowing: Spontaneous and intermittent  Type of Nipple: Everted at rest and after stimulation  Comfort (Breast/Nipple): Soft / non-tender  Hold (Positioning): Assistance needed to correctly position infant at breast and maintain latch.  LATCH Score: 9   Lactation Tools Discussed/Used    Interventions Interventions: Support pillows;Assisted with latch;Adjust position;Skin to skin;Position options;Breast compression;Education  Discharge    Consult Status Consult Status: Follow-up from L&D    Frederico Hamman 10/26/2022, 10:17 PM

## 2022-10-26 NOTE — Progress Notes (Signed)
Sonya Dunn is a 22 y.o. G2P1001 at [redacted]w[redacted]d by ultrasound admitted for induction of labor due to increased BPs without diagnosis of hypertension. History of PreE in a previous pregnancy. Anemia in pregnancy, with starting hgb 8.0.   Subjective: Patient doing well. Desires unmedicated vaginal delivery. FOB, MIL and Patient Mother present at bedside and supportive. Introductions exchanged.   Objective: BP 117/76   Pulse 61   Temp 98.2 F (36.8 C) (Oral)   Resp 18   Ht 5\' 2"  (1.575 m)   Wt 95.7 kg   LMP 12/29/2021   SpO2 100%   BMI 38.59 kg/m  I/O last 3 completed shifts: In: 275.4 [I.V.:17.1; IV Piggyback:258.3] Out: -  No intake/output data recorded.  FHT:  FHR: 135 bpm, variability: moderate,  accelerations:  Present,  decelerations:  Absent UC:   regular, every 2-3 minutes SVE:   Dilation: 3.5 Effacement (%): 60 Station: -1 Exam by:: Ileana Ladd, RN  Labs: Lab Results  Component Value Date   WBC 8.7 10/26/2022   HGB 8.0 (L) 10/26/2022   HCT 25.5 (L) 10/26/2022   MCV 82.8 10/26/2022   PLT 187 10/26/2022  Patient Vitals for the past 24 hrs:  BP Temp Temp src Pulse Resp SpO2 Height Weight  10/26/22 0931 (!) 152/75 -- -- 83 -- -- -- --  10/26/22 0900 125/82 -- -- 70 -- -- -- --  10/26/22 0849 127/86 -- -- 68 -- -- -- --  10/26/22 0742 117/76 98.2 F (36.8 C) Oral 61 -- -- -- --  10/26/22 0540 127/81 -- -- 72 18 -- -- --  10/26/22 0343 128/87 98.2 F (36.8 C) Oral 76 18 -- 5\' 2"  (1.575 m) 95.7 kg  10/26/22 0316 125/76 -- -- 66 -- -- -- --  10/26/22 0301 127/84 -- -- 68 -- -- -- --  10/26/22 0246 130/87 -- -- 73 -- -- -- --  10/26/22 0231 (!) 141/83 -- -- 64 -- -- -- --  10/26/22 0216 132/80 -- -- 78 -- -- -- --  10/26/22 0201 130/86 -- -- 72 -- -- -- --  10/26/22 0151 -- -- -- -- -- 100 % -- --  10/26/22 0146 135/89 -- -- 67 -- 100 % -- --  10/26/22 0140 -- -- -- -- -- 100 % -- --  10/26/22 0136 130/89 -- -- 72 -- 99 % -- --  10/26/22 0134 130/89 99.1 F  (37.3 C) Oral 72 (!) 22 100 % 5\' 2"  (1.575 m) 95.7 kg     Assessment / Plan: Induction of labor due to increased BPs without dx of hypertension in the presence of history of PreE,  on pitocin.   Labor: Progressing on Pitocin, will continue to increase then AROM Preeclampsia:  labs stable. PCR 0.19. No preE at this time. Continuing to monitor BPs  Fetal Wellbeing:  Category I- continuous monitoring on Pit  Pain Control:  Labor support without medications and IV pain meds I/D:   GBS negative  Anticipated MOD:  NSVD  Claudette Head, CNM 10/26/2022, 8:42 AM

## 2022-10-26 NOTE — Telephone Encounter (Signed)
Called Pt and left a voicemail to remind of NP appt

## 2022-10-26 NOTE — Anesthesia Preprocedure Evaluation (Signed)
Anesthesia Evaluation  Patient identified by MRN, date of birth, ID band Patient awake    Reviewed: Allergy & Precautions, H&P , NPO status , Patient's Chart, lab work & pertinent test results  Airway Mallampati: II   Neck ROM: full    Dental   Pulmonary neg pulmonary ROS   breath sounds clear to auscultation       Cardiovascular hypertension,  Rhythm:regular Rate:Normal     Neuro/Psych  Headaches PSYCHIATRIC DISORDERS Anxiety Depression       GI/Hepatic   Endo/Other    Renal/GU      Musculoskeletal   Abdominal   Peds  Hematology  (+) Blood dyscrasia, anemia   Anesthesia Other Findings   Reproductive/Obstetrics (+) Pregnancy                             Anesthesia Physical Anesthesia Plan  ASA: 2  Anesthesia Plan: Epidural   Post-op Pain Management:    Induction: Intravenous  PONV Risk Score and Plan: 2 and Treatment may vary due to age or medical condition  Airway Management Planned: Natural Airway  Additional Equipment:   Intra-op Plan:   Post-operative Plan:   Informed Consent: I have reviewed the patients History and Physical, chart, labs and discussed the procedure including the risks, benefits and alternatives for the proposed anesthesia with the patient or authorized representative who has indicated his/her understanding and acceptance.       Plan Discussed with: Anesthesiologist  Anesthesia Plan Comments:        Anesthesia Quick Evaluation

## 2022-10-26 NOTE — MAU Note (Signed)
.  Sonya Dunn is a 22 y.o. at [redacted]w[redacted]d here in MAU reporting: contractions every 5-8 minutes since 2230, rates contractions 8/10 in lower back and lower abdominal area. Patient reports hearing a 'pop' earlier today around 2130 and is unsure if her water broke. Reports when she sits on toilet to urinate she feels like she is having a clear watery discharge without urinating. Patient reports light pink bleeding. Patient denies wearing a pad at this time.   Reports +FM.  Patient reports last SVE was fingertip  Onset of complaint: 2130 Pain score: 8/10 Vitals:   10/26/22 0301 10/26/22 0316  BP: 127/84 125/76  Pulse: 68 66  Resp:    Temp:    SpO2:       FHT:141 Lab orders placed from triage:  mau labor eval

## 2022-10-26 NOTE — Progress Notes (Addendum)
Sonya Dunn is a 22 y.o. G2P1001 at [redacted]w[redacted]d admitted for IOL due to elevated BPs. History of PreE with a starting Hgb  of 8.0.   Subjective: Patient uncomfortable following SROM and requesting epidural placement.   Objective: BP 124/79   Pulse 84   Temp 98 F (36.7 C) (Oral)   Resp 18   Ht 5\' 2"  (1.575 m)   Wt 95.7 kg   LMP 12/29/2021   SpO2 100%   BMI 38.59 kg/m  I/O last 3 completed shifts: In: 275.4 [I.V.:17.1; IV Piggyback:258.3] Out: -  No intake/output data recorded.  FHT:  FHR: 130 bpm, variability: moderate,  accelerations:  Abscent,  decelerations:  Absent UC:   regular, every 2-4 minutes  SVE:   Dilation: 5.5 Effacement (%): 80 Station: Plus 1 Exam by:: Lexi Ament, RN  Labs: Lab Results  Component Value Date   WBC 12.9 (H) 10/26/2022   HGB 8.6 (L) 10/26/2022   HCT 27.6 (L) 10/26/2022   MCV 82.1 10/26/2022   PLT 181 10/26/2022  Patient Vitals for the past 24 hrs:  BP Temp Temp src Pulse Resp SpO2 Height Weight  10/26/22 1430 124/79 -- -- 84 -- -- -- --  10/26/22 1402 128/83 -- -- 68 -- -- -- --  10/26/22 1350 130/89 -- -- 67 -- -- -- --  10/26/22 1349 -- 98 F (36.7 C) Oral -- -- -- -- --  10/26/22 1302 123/75 -- -- 78 -- -- -- --  10/26/22 1232 115/75 -- -- 67 -- -- -- --  10/26/22 1208 112/75 98 F (36.7 C) Oral 72 -- -- -- --  10/26/22 1101 127/72 -- -- 67 -- -- -- --  10/26/22 1055 127/84 -- -- 71 -- -- -- --  10/26/22 1001 127/80 -- -- 78 -- -- -- --  10/26/22 0939 123/75 -- -- 70 -- -- -- --  10/26/22 0931 (!) 152/75 -- -- 83 -- -- -- --  10/26/22 0900 125/82 -- -- 70 -- -- -- --  10/26/22 0849 127/86 -- -- 68 -- -- -- --  10/26/22 0742 117/76 98.2 F (36.8 C) Oral 61 -- -- -- --  10/26/22 0540 127/81 -- -- 72 18 -- -- --  10/26/22 0343 128/87 98.2 F (36.8 C) Oral 76 18 -- 5\' 2"  (1.575 m) 95.7 kg  10/26/22 0316 125/76 -- -- 66 -- -- -- --  10/26/22 0301 127/84 -- -- 68 -- -- -- --  10/26/22 0246 130/87 -- -- 73 -- -- -- --   10/26/22 0231 (!) 141/83 -- -- 64 -- -- -- --  10/26/22 0216 132/80 -- -- 78 -- -- -- --  10/26/22 0201 130/86 -- -- 72 -- -- -- --  10/26/22 0151 -- -- -- -- -- 100 % -- --  10/26/22 0146 135/89 -- -- 67 -- 100 % -- --  10/26/22 0140 -- -- -- -- -- 100 % -- --  10/26/22 0136 130/89 -- -- 72 -- 99 % -- --  10/26/22 0134 130/89 99.1 F (37.3 C) Oral 72 (!) 22 100 % 5\' 2"  (1.575 m) 95.7 kg     Assessment / Plan: Induction of labor due to elevated BPs at term, s/p AROM. Pit stopped following decel.   Labor: Plan to restart pit following epiural if needed and Continue to titrate pit up 2x2 as needed for labor.  Preeclampsia:  labs stable, BPs normotensive.  Fetal Wellbeing:  Category II Pain Control:  Epidural I/D:  GBS positive < PCN Anticipated MOD:  NSVD  Claudette Head, CNM 10/26/2022, 4:33 PM

## 2022-10-26 NOTE — Progress Notes (Signed)
Vascular to team to room for IV access

## 2022-10-26 NOTE — H&P (Signed)
Sonya Dunn is a 22 y.o. G47P1001 female at [redacted]w[redacted]d by u/s @ [redacted]w[redacted]d presenting to [facility] for a labor eval (ctx and ? ROM), however she had an elevated BP with decision for admission and induction.   Reports active fetal movement, contractions: irreg, vaginal bleeding: none, membranes: intact.  Initiated prenatal care at [facility] at 10.2 wks.   Most recent u/s : [redacted]w[redacted]d, EFW 43%, AFI 15cm, ant placenta, cephalic.   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  Prenatal History/Complications:  # hx pre-e w first preg  Past Medical History: Past Medical History:  Diagnosis Date   Anxiety    Chlamydia    Depression    Geographical tongue    Headache    Pregnancy induced hypertension    Seasonal allergies     Past Surgical History: Past Surgical History:  Procedure Laterality Date   EYE SURGERY      Obstetrical History: OB History     Gravida  2   Para  1   Term  1   Preterm      AB      Living  1      SAB      IAB      Ectopic      Multiple  0   Live Births  1           Social History: Social History   Socioeconomic History   Marital status: Single    Spouse name: [place]   Number of children: Not on file   Years of education: Not on file   Highest education level: Not on file  Occupational History   Not on file  Tobacco Use   Smoking status: Never   Smokeless tobacco: Never  Vaping Use   Vaping Use: Never used  Substance and Sexual Activity   Alcohol use: Never   Drug use: Not Currently    Types: Marijuana    Comment: more than 30days   Sexual activity: Yes    Partners: Male    Birth control/protection: None  Other Topics Concern   Not on file  Social History Narrative   Not on file   Social Determinants of Health   Financial Resource Strain: Not on file  Food Insecurity: Not on file  Transportation Needs: Not on file  Physical Activity: Not on file  Stress: Not on file  Social Connections: Not on file     Family History: Family History  Problem Relation Age of Onset   Healthy Mother    Healthy Father    Hypertension Paternal Grandfather     Allergies: Allergies  Allergen Reactions   Other     Seasonal    Medications Prior to Admission  Medication Sig Dispense Refill Last Dose   Prenatal Vit-Fe Fumarate-FA (PREPLUS) 27-1 MG TABS Take 1 tablet by mouth daily. 30 tablet 13 10/26/2022   acetaminophen (TYLENOL) 650 MG CR tablet Take 650 mg by mouth every 8 (eight) hours as needed for pain.      acetaminophen-caffeine (EXCEDRIN TENSION HEADACHE) 500-65 MG TABS per tablet Take 1 tablet by mouth every 8 (eight) hours as needed. 60 tablet 0    aspirin 81 MG chewable tablet Chew 1 tablet (81 mg total) by mouth daily. (Patient not taking: Reported on 09/24/2022) 30 tablet 6    Blood Pressure Monitor KIT 1 kit by Does not apply route daily. 1 kit 0    cetirizine (ZYRTEC ALLERGY) 10 MG tablet Take 1  tablet (10 mg total) by mouth daily. 30 tablet 11    cyclobenzaprine (FLEXERIL) 5 MG tablet Take 1 tablet (5 mg total) by mouth every 8 (eight) hours as needed for muscle spasms. 30 tablet 0    famotidine (PEPCID) 20 MG tablet Take 1 tablet (20 mg total) by mouth 2 (two) times daily. 60 tablet 3    iron polysaccharides (NIFEREX) 150 MG capsule Take 1 capsule (150 mg total) by mouth daily. 30 capsule 3    metoCLOPramide (REGLAN) 5 MG tablet Take 1 tablet (5 mg total) by mouth 4 (four) times daily -  before meals and at bedtime. 60 tablet 1    metroNIDAZOLE (FLAGYL) 500 MG tablet Take 1 tablet (500 mg total) by mouth 2 (two) times daily. 14 tablet 2    ondansetron (ZOFRAN-ODT) 8 MG disintegrating tablet Take 1 tablet (8 mg total) by mouth every 8 (eight) hours as needed for nausea or vomiting. (Patient not taking: Reported on 10/08/2022) 60 tablet 2    terconazole (TERAZOL 3) 0.8 % vaginal cream Place 1 applicator vaginally at bedtime. (Patient not taking: Reported on 10/22/2022) 20 g 0     Review of  Systems  Pertinent pos/neg as indicated in HPI  Blood pressure 125/76, pulse 66, temperature 99.1 F (37.3 C), temperature source Oral, resp. rate (!) 22, height 5\' 2"  (1.575 m), weight 95.7 kg, last menstrual period 12/29/2021, SpO2 100 %. General appearance: alert, cooperative, and no distress Lungs: clear to auscultation bilaterally Heart: regular rate and rhythm Abdomen: gravid, soft, non-tender, EFW by Leopold's approximately 6-7lbs Extremities: 1+ edema  Fetal monitoring: FHR: 150s bpm, variability: moderate,  Accelerations: Present,  decelerations:  Absent Uterine activity: Frequency: Every 2-3 minutes Dilation: 3 Effacement (%): 50 Station: -2 Exam by:: Henrine Screws, RN Presentation: cephalic   Prenatal labs: ABO, Rh: O/Positive/-- (11/14 1357) Antibody: Negative (11/14 1357) Rubella: 4.96 (11/14 1357) RPR: Non Reactive (04/04 0900)  HBsAg: Negative (11/14 1357)  HIV: Non Reactive (04/04 0900)  GBS: Positive/-- (05/16 1118)  2hr GTT: 64/109/99  Prenatal Transfer Tool  Maternal Diabetes: No Genetic Screening: Normal Maternal Ultrasounds/Referrals: Normal Fetal Ultrasounds or other Referrals:  None Maternal Substance Abuse:  No Significant Maternal Medications:  None Significant Maternal Lab Results: Group B Strep positive  Results for orders placed or performed during the hospital encounter of 10/26/22 (from the past 24 hour(s))  POCT fern test   Collection Time: 10/26/22  1:39 AM  Result Value Ref Range   POCT Fern Test Negative = intact amniotic membranes      Assessment:  [redacted]w[redacted]d SIUP  G2P1001  Elevated BP without dx of gHTN  Cat 1 FHR  GBS Positive/-- (05/16 1118)  Plan:  Admit to L&D  IV pain meds/epidural prn active labor  Start Pitocin augmentation prn  PCN ppx for GBS  Collect pre-e labs  Anticipate vag delivery   Plans to breastfeed  Contraception: possibly pill   Arabella Merles CNM 10/26/2022, 4:07 AM

## 2022-10-26 NOTE — Progress Notes (Signed)
CNM called to bedside patient feeling "pushy".   Exam unchanged, and patient decels while on her back for exam. Dr. Crissie Reese at bedside and reviewing tracing. Pit discontinued and terb ordered. IV bolus initiated and patient turned to left side. FHT return back to baseline.   Patient desires epidural. Plan to reassess following epidural placement.   Sonya Dunn) Suzie Portela, MSN, CNM  [facility]  10/26/2022 3:41 PM

## 2022-10-26 NOTE — Anesthesia Procedure Notes (Signed)
Epidural Patient location during procedure: OB Start time: 10/26/2022 4:27 PM End time: 10/26/2022 4:38 PM  Staffing Anesthesiologist: Achille Rich, MD Performed: anesthesiologist   Preanesthetic Checklist Completed: patient identified, IV checked, site marked, risks and benefits discussed, monitors and equipment checked, pre-op evaluation and timeout performed  Epidural Patient position: sitting Prep: DuraPrep Patient monitoring: heart rate, cardiac monitor, continuous pulse ox and blood pressure Approach: midline Location: L2-L3 Injection technique: LOR saline  Needle:  Needle type: Tuohy  Needle gauge: 17 G Needle length: 9 cm Needle insertion depth: 7 cm Catheter type: closed end flexible Catheter size: 19 Gauge Catheter at skin depth: 12 cm Test dose: negative and Other  Assessment Events: blood not aspirated, injection not painful, no injection resistance and negative IV test  Additional Notes Informed consent obtained prior to proceeding including risk of failure, 1% risk of PDPH, risk of minor discomfort and bruising.  Discussed rare but serious complications including epidural abscess, permanent nerve injury, epidural hematoma.  Discussed alternatives to epidural analgesia and patient desires to proceed.  Timeout performed pre-procedure verifying patient name, procedure, and platelet count.  Patient tolerated procedure well. Reason for block:procedure for pain

## 2022-10-26 NOTE — Progress Notes (Signed)
Orders submitted for IV access.

## 2022-10-26 NOTE — Progress Notes (Signed)
Sonya Dunn is a 22 y.o. G2P1001 at [redacted]w[redacted]d by ultrasound admitted for induction of labor due to increased BPs without diagnosis of hypertension. History of PreE in a previous pregnancy. Anemia in pregnancy, with starting hgb 8.0.   Subjective: Patient doing well. Family and friends at bedside and supportive.   Objective: BP 128/83   Pulse 68   Temp 98 F (36.7 C) (Oral)   Resp 18   Ht 5\' 2"  (1.575 m)   Wt 95.7 kg   LMP 12/29/2021   SpO2 100%   BMI 38.59 kg/m  I/O last 3 completed shifts: In: 275.4 [I.V.:17.1; IV Piggyback:258.3] Out: -  No intake/output data recorded.  FHT:  FHR: 135 bpm, variability: moderate,  accelerations:  Present,  decelerations:  Absent UC:   regular, every 2-3 minutes SVE:   Dilation: 5 Effacement (%): 60, 70 Station: -1 Exam by:: Rhunette Croft CMN  Labs: Lab Results  Component Value Date   WBC 8.7 10/26/2022   HGB 8.0 (L) 10/26/2022   HCT 25.5 (L) 10/26/2022   MCV 82.8 10/26/2022   PLT 187 10/26/2022   Patient Vitals for the past 24 hrs:  BP Temp Temp src Pulse Resp SpO2 Height Weight  10/26/22 1402 128/83 -- -- 68 -- -- -- --  10/26/22 1350 130/89 -- -- 67 -- -- -- --  10/26/22 1349 -- 98 F (36.7 C) Oral -- -- -- -- --  10/26/22 1302 123/75 -- -- 78 -- -- -- --  10/26/22 1232 115/75 -- -- 67 -- -- -- --  10/26/22 1208 112/75 98 F (36.7 C) Oral 72 -- -- -- --  10/26/22 1101 127/72 -- -- 67 -- -- -- --  10/26/22 1055 127/84 -- -- 71 -- -- -- --  10/26/22 1001 127/80 -- -- 78 -- -- -- --  10/26/22 0939 123/75 -- -- 70 -- -- -- --  10/26/22 0931 (!) 152/75 -- -- 83 -- -- -- --  10/26/22 0900 125/82 -- -- 70 -- -- -- --  10/26/22 0849 127/86 -- -- 68 -- -- -- --  10/26/22 0742 117/76 98.2 F (36.8 C) Oral 61 -- -- -- --  10/26/22 0540 127/81 -- -- 72 18 -- -- --  10/26/22 0343 128/87 98.2 F (36.8 C) Oral 76 18 -- 5\' 2"  (1.575 m) 95.7 kg  10/26/22 0316 125/76 -- -- 66 -- -- -- --  10/26/22 0301 127/84 -- -- 68 -- -- -- --   10/26/22 0246 130/87 -- -- 73 -- -- -- --  10/26/22 0231 (!) 141/83 -- -- 64 -- -- -- --  10/26/22 0216 132/80 -- -- 78 -- -- -- --  10/26/22 0201 130/86 -- -- 72 -- -- -- --  10/26/22 0151 -- -- -- -- -- 100 % -- --  10/26/22 0146 135/89 -- -- 67 -- 100 % -- --  10/26/22 0140 -- -- -- -- -- 100 % -- --  10/26/22 0136 130/89 -- -- 72 -- 99 % -- --  10/26/22 0134 130/89 99.1 F (37.3 C) Oral 72 (!) 22 100 % 5\' 2"  (1.575 m) 95.7 kg   CNM to patient bedsdie to discuss AROM. Reviewed risks and benefits with patient. Fetal head well applied to cervix and CAt I tracing. Patient agrees with plan of care to AROM. AROM successful with 1 pass of amnihook. Large amount of clear fluid noted. Fetal head remained well applied to cervix and Cat I following AROM.   Assessment /  Plan: Induction of labor due to elevated BPs at term without diagnosis of hypertension,  progressing well on pitocin  Labor:  AROM with clear fluid. Continue to titrate pit up as needed for labor progression.  Preeclampsia: labs stable.  Fetal Wellbeing:  Category I- continuous monitoring while on pit.  Pain Control:  Labor support without medications and IV pain meds I/D:   GBS positive < PCN Anticipated MOD:  NSVD  Claudette Head, CNM 10/26/2022, 2:12 PM

## 2022-10-27 LAB — CBC
HCT: 23.8 % — ABNORMAL LOW (ref 36.0–46.0)
Hemoglobin: 7.5 g/dL — ABNORMAL LOW (ref 12.0–15.0)
MCH: 26.1 pg (ref 26.0–34.0)
MCHC: 31.5 g/dL (ref 30.0–36.0)
MCV: 82.9 fL (ref 80.0–100.0)
Platelets: 180 10*3/uL (ref 150–400)
RBC: 2.87 MIL/uL — ABNORMAL LOW (ref 3.87–5.11)
RDW: 14.1 % (ref 11.5–15.5)
WBC: 10.4 10*3/uL (ref 4.0–10.5)
nRBC: 0 % (ref 0.0–0.2)

## 2022-10-27 MED ORDER — SODIUM CHLORIDE 0.9 % IV SOLN
500.0000 mg | Freq: Once | INTRAVENOUS | Status: AC
  Administered 2022-10-27: 500 mg via INTRAVENOUS
  Filled 2022-10-27: qty 25

## 2022-10-27 NOTE — Progress Notes (Signed)
POSTPARTUM PROGRESS NOTE  Post Partum Day 1  Subjective:  Sonya Dunn is a 22 y.o. [id number] s/p VD at [redacted]w[redacted]d.  She reports she is doing well. No acute events overnight. She denies any problems with ambulating, voiding or po intake. Denies nausea or vomiting.  Pain is well controlled.  Lochia is adequate.  Objective: Blood pressure 123/84, pulse 65, temperature 98.5 F (36.9 C), temperature source Oral, resp. rate 19, height 5\' 2"  (1.575 m), weight 95.7 kg, last menstrual period 12/29/2021, SpO2 100 %, unknown if currently breastfeeding.  Physical Exam:  General: alert, cooperative and no distress Chest: no respiratory distress Heart:regular rate, distal pulses intact Uterine Fundus: firm, appropriately tender DVT Evaluation: No calf swelling or tenderness Extremities: no edema Skin: warm, dry  Recent Labs    10/26/22 1549 10/27/22 0604  HGB 8.6* 7.5*  HCT 27.6* 23.8*    Assessment/Plan: Sonya Dunn is a 22 y.o. [id number] s/p VD at [redacted]w[redacted]d   PPD#1 - Doing well  Routine postpartum care Anemia: Asx- Venofer Contraception: Pills Feeding: [place]rest Dispo: Plan for discharge tomorrow.   LOS: 1 day   Myrtie Hawk, DO OB Fellow  10/27/2022, 8:27 AM

## 2022-10-27 NOTE — Progress Notes (Signed)
Spoke with Dr. Nobie Putnam. He would recommend that patient finish IV iron but that if she wants to take oral that's fine. I advised that I had spoke with patient at length that given her hemoglobin this morning IV iron would be advisable, since oral would take much longer to bring her levels up, but patient still refusing. No new orders at this time.

## 2022-10-27 NOTE — Lactation Note (Signed)
This note was copied from a baby's chart. Lactation Consultation Note  Patient Name: Sonya Dunn [id number] Date: 10/27/2022 Age:22 hours Reason for consult: Initial assessment  P2, Baby skin to skin on mother's chest and demonstrating feeding cues.  Mother latched baby in football hold with intermittent swallows.  Flanged bottom lip.  Feed on demand with cues.  Goal 8-12+ times per day after first 24 hrs.  Lactation information sheet given.    Maternal Data Has patient been taught Hand Expression?: Yes Does the patient have breastfeeding experience prior to this delivery?: Yes How long did the patient breastfeed?: 2 mos.  Feeding Mother's Current Feeding Choice: Breast Milk  LATCH Score Latch: Grasps breast easily, tongue down, lips flanged, rhythmical sucking.  Audible Swallowing: A few with stimulation  Type of Nipple: Everted at rest and after stimulation  Comfort (Breast/Nipple): Soft / non-tender  Hold (Positioning): Assistance needed to correctly position infant at breast and maintain latch.  LATCH Score: 8 Interventions Interventions: Breast feeding basics reviewed;Assisted with latch;Skin to skin;Support pillows;Education Consult Status Consult Status: Follow-up Date: 10/28/22 Follow-up type: In-patient    [name] [facility] 10/27/2022, 3:28 PM

## 2022-10-27 NOTE — Progress Notes (Signed)
Patient called out due to IV burning. Upon entering room patient crying and shaking. Iron infusion paused and IV flushed, no blood return and some swelling noted around insertion site and patient endorsed pain upon flushing. IV removed. Patient refusing to have another IV started to finish IV iron and wants to get iron another way. States that she is scheduled to get an iron infusion on June 14th at the infusion center and does not want to get IV iron here. Will notify OB on call.

## 2022-10-27 NOTE — Anesthesia Postprocedure Evaluation (Signed)
Anesthesia Post Note  Patient: Sonya Dunn  Procedure(s) Performed: AN AD HOC LABOR EPIDURAL     Patient location during evaluation: Mother Baby Anesthesia Type: Epidural Level of consciousness: awake and alert and oriented Pain management: satisfactory to patient Vital Signs Assessment: post-procedure vital signs reviewed and stable Respiratory status: respiratory function stable Cardiovascular status: stable Postop Assessment: no headache, no backache, epidural receding, patient able to bend at knees, no signs of nausea or vomiting, adequate PO intake and able to ambulate Anesthetic complications: no   No notable events documented.  Last Vitals:  Vitals:   10/27/22 0007 10/27/22 0425  BP: 125/82 131/80  Pulse: 72 (!) 52  Resp: 18 18  Temp: 37.3 C 36.8 C  SpO2: 100% 100%    Last Pain:  Vitals:   10/27/22 0425  TempSrc: Oral  PainSc: 0-No pain   Pain Goal: Patients Stated Pain Goal: 2 (10/26/22 0343)                 Karleen Dolphin

## 2022-10-27 NOTE — Clinical Social Work Maternal (Signed)
CLINICAL SOCIAL WORK MATERNAL/CHILD NOTE  Patient Details  Name: FLEETA AMES MRN: [id number] Date of Birth: 2000/06/05  Date:  10/27/2022  Clinical Social Worker Initiating Note:  Enos Fling Date/Time: Initiated:  10/27/22/1210     Child's Name:  Adele Barthel   Biological Parents:  Mother, Father Surai Pless and Jewel Baize)   Need for Interpreter:  None   Reason for Referral:  Behavioral Health Concerns, Current Substance Use/Substance Use During Pregnancy     Address:  [address] [place] [address] [place]    Phone number:    [phone]  Additional phone number:   Household Members/Support Persons (HM/SP):   Household Member/Support Person 1   HM/SP Name Relationship DOB or Age  HM/SP -1 Anastasio Champion MOB's Son 11-07-2018  HM/SP -2        HM/SP -3        HM/SP -4        HM/SP -5        HM/SP -6        HM/SP -7        HM/SP -8          Natural Supports (not living in the home):  Extended Family, Immediate Family   Professional Supports: None   Employment: Unemployed   Type of Work:     Education:  9 to 11 years   Homebound arranged:    Surveyor, quantity Resources:  Medicaid   Other Resources:  Sales executive  , [employer]   Cultural/Religious Considerations Which May Impact Care:    Strengths:  Ability to meet basic needs  , Merchandiser, retail, Home prepared for child     Psychotropic Medications:         Pediatrician:    Armed forces operational officer area  Pediatrician List:   [facility] ([address] [employer])  [place]    [place]      Pediatrician Fax Number:    Risk Factors/Current Problems:  Mental Health Concerns  , Substance Use     Cognitive State:  Alert  , Able to Concentrate  , Goal Oriented  , Insightful  , Linear Thinking     Mood/Affect:  Interested  , Comfortable  , Relaxed  , Bright  , Calm  , Happy     CSW Assessment: CSW received a  consult for drug exposed newborn, anxiety and depression. CSW met MOB at bedside to complete a full psychosocial assessment. CSW entered the room, introduced herself, and acknowledged that guest was present. MOB gave CSW verbal permission to speak about anything while guest were present. CSW explained her role and the reason for the visit. MOB presented as calm, was agreeable to consult and remained engaged throughout encounter.  CSW collected MOB demographics information and asked about her mental health history. MOB reported being diagnosed with anxiety and depression in 2019 after the passing of her grandmother. MOB denied being prescribed medication; however has participated in therapy at [facility], [facility] for support of symptoms. MOB reported planning to return with visits in order to be proactive about her mental health during this PP period. CSW provided education regarding the baby blues period vs. perinatal mood disorders, discussed treatment and gave resources for mental health follow up if concerns arise.  CSW recommends self-evaluation during the postpartum time period using the New Mom Checklist from Postpartum Progress and encouraged  MOB to contact a medical professional if symptoms are noted at any time. CSW assessed for safety with MOB SI and HI; MOB denied all. CSW did not assess for DV; FOB was present.   CSW assessed for resources needs with MOB; and she reported receiving support from [facility] and foodstamps. MOB reported having all essential items for infant including a car seat, bassinet and crib for safe sleeping. CSW provided review of Sudden Infant Death Syndrome (SIDS) precautions.  CSW informed MOB with the use THC during pregnancy; the hospital will complete a UDS and CDS on infant. If the screenings return with positive results a report to CPS will be made; MOB was understanding. MOB reported her last use was over a month ago due to nausea during her  pregnancy. MOB reported prior to pregnancy her usage of THC, was as needed for 4 years and denied the use of any other illicit drugs.  CSW Plan/Description:    No Further Intervention Required/No Barriers to Discharge, Sudden Infant Death Syndrome (SIDS) Education, Perinatal Mood and Anxiety Disorder (PMADs) Education, Hospital Drug Screen Policy Information, Other Information/Referral to [facility], CSW Will Continue to Monitor Umbilical Cord Tissue and Urine Drug Screen Results and Make Report if Joanie Coddington, LCSW 10/27/2022, 12:12 PM

## 2022-10-28 ENCOUNTER — Other Ambulatory Visit (HOSPITAL_COMMUNITY): Payer: Self-pay

## 2022-10-28 ENCOUNTER — Encounter: Payer: Self-pay | Admitting: Student

## 2022-10-28 MED ORDER — ACETAMINOPHEN 325 MG PO TABS
650.0000 mg | ORAL_TABLET | ORAL | 0 refills | Status: DC | PRN
  Filled 2022-10-28: qty 100, 9d supply, fill #0

## 2022-10-28 MED ORDER — IBUPROFEN 600 MG PO TABS
600.0000 mg | ORAL_TABLET | Freq: Four times a day (QID) | ORAL | 0 refills | Status: DC
  Filled 2022-10-28: qty 30, 8d supply, fill #0

## 2022-10-28 MED ORDER — NORETHINDRONE 0.35 MG PO TABS
1.0000 | ORAL_TABLET | Freq: Every day | ORAL | 11 refills | Status: DC
  Filled 2022-10-28: qty 28, 28d supply, fill #0

## 2022-10-29 ENCOUNTER — Encounter: Payer: Medicaid Other | Admitting: Family Medicine

## 2022-11-03 ENCOUNTER — Encounter: Payer: Self-pay | Admitting: Family Medicine

## 2022-11-05 ENCOUNTER — Ambulatory Visit: Payer: Medicaid Other | Admitting: Family Medicine

## 2022-11-05 ENCOUNTER — Encounter: Payer: Medicaid Other | Admitting: Obstetrics and Gynecology

## 2022-11-05 ENCOUNTER — Telehealth (HOSPITAL_COMMUNITY): Payer: Self-pay

## 2022-11-05 ENCOUNTER — Ambulatory Visit: Payer: Medicaid Other

## 2022-11-05 NOTE — Telephone Encounter (Signed)
11/05/2022 1931  Name: Sonya Dunn MRN: [id number] DOB: 17-Mar-2001  Reason for Call:  Transition of Care Hospital Discharge Call  Contact Status: Patient Contact Status: Unable to contact (No answer, no voicemail option.)  Language assistant needed: Interpreter Mode: Interpreter Not Needed        Follow-Up Questions:    Edinburgh Postnatal Depression Scale:  In the Past 7 Days:    PHQ2-9 Depression Scale:     Discharge Follow-up:    Post-discharge interventions: NA  Signature Signe Colt

## 2022-11-06 ENCOUNTER — Ambulatory Visit: Payer: Medicaid Other

## 2022-11-10 ENCOUNTER — Telehealth: Payer: Self-pay

## 2022-11-10 NOTE — Telephone Encounter (Signed)
Received a call from [facility] stating that they have been treating patient for a clogged milk duct. Denies pain. Knot is about 3 cm left edge of the areola. Knot has been present for 12 days. Has been treated with current recommendations for clogged duct ice and avoid over pumping.   Attempted to call patient to further discuss and schedule appt for further evaluation. No answer. Unable to leave vm.

## 2022-11-23 ENCOUNTER — Ambulatory Visit: Payer: Medicaid Other | Admitting: Student

## 2023-03-22 ENCOUNTER — Ambulatory Visit: Payer: Self-pay

## 2023-05-30 ENCOUNTER — Telehealth: Payer: Medicaid Other | Admitting: Physician Assistant

## 2023-05-30 NOTE — Progress Notes (Signed)
 The patient no-showed for appointment despite this provider sending direct link,  with no response and waiting for at least 10 minutes from appointment time for patient to join. They will be marked as a NS for this appointment/time.   Kasandra Knudsen Mayers, PA-C

## 2023-05-31 ENCOUNTER — Other Ambulatory Visit (HOSPITAL_COMMUNITY)
Admission: RE | Admit: 2023-05-31 | Discharge: 2023-05-31 | Disposition: A | Discharge status: Home / Self Care | Payer: Medicaid Other | Source: Ambulatory Visit | Attending: Obstetrics and Gynecology | Admitting: Obstetrics and Gynecology

## 2023-05-31 ENCOUNTER — Ambulatory Visit: Payer: Medicaid Other | Admitting: *Deleted

## 2023-05-31 VITALS — BP 116/75 | HR 81 | Wt 178.4 lb

## 2023-05-31 DIAGNOSIS — Z113 Encounter for screening for infections with a predominantly sexual mode of transmission: Secondary | ICD-10-CM

## 2023-05-31 NOTE — Progress Notes (Signed)
.  SUBJECTIVE:  23 y.o. female complains of white vaginal discharge for 2 week(s). Denies abnormal vaginal bleeding or significant pelvic pain or fever. No UTI symptoms. Denies history of known exposure to STD.  No LMP recorded.  OBJECTIVE:  She appears well, afebrile.  ASSESSMENT:  Vaginal Discharge  Vaginal Odor   PLAN:  GC, chlamydia, trichomonas, BVAG, CVAG probe sent to lab. Treatment: To be determined once lab results are received ROV prn if symptoms persist or worsen.    SUBJECTIVE:  23 y.o. female who desires a STI screen. Denies abnormal vaginal discharge, bleeding or significant pelvic pain. No UTI symptoms. Denies history of known exposure to STD.  No LMP recorded.  OBJECTIVE:  She appears well.   ASSESSMENT:  STI Screen   PLAN:  Pt offered STI blood screening-requested GC, chlamydia, and trichomonas probe sent to lab.  Treatment: To be determined once lab results are received.  Pt follow up as needed.

## 2023-06-01 LAB — HEPATITIS C ANTIBODY: Hep C Virus Ab: NONREACTIVE

## 2023-06-01 LAB — CERVICOVAGINAL ANCILLARY ONLY
Bacterial Vaginitis (gardnerella): POSITIVE — AB
Candida Glabrata: NEGATIVE
Candida Vaginitis: NEGATIVE
Chlamydia: NEGATIVE
Comment: NEGATIVE
Comment: NEGATIVE
Comment: NEGATIVE
Comment: NEGATIVE
Comment: NEGATIVE
Comment: NORMAL
Neisseria Gonorrhea: NEGATIVE
Trichomonas: NEGATIVE

## 2023-06-01 LAB — RPR: RPR Ser Ql: NONREACTIVE

## 2023-06-01 LAB — HIV ANTIBODY (ROUTINE TESTING W REFLEX): HIV Screen 4th Generation wRfx: NONREACTIVE

## 2023-06-01 LAB — HEPATITIS B SURFACE ANTIGEN: Hepatitis B Surface Ag: NEGATIVE

## 2023-06-01 MED ORDER — METRONIDAZOLE 500 MG PO TABS: 500.0000 mg | ORAL_TABLET | Freq: Two times a day (BID) | ORAL | 0 refills | Status: DC

## 2023-06-01 NOTE — Addendum Note (Signed)
 Addended by: Catalina Antigua on: 06/01/2023 06:39 PM   Modules accepted: Orders

## 2023-07-12 ENCOUNTER — Telehealth: Payer: Medicaid Other | Admitting: Physician Assistant

## 2023-07-12 DIAGNOSIS — B379 Candidiasis, unspecified: Secondary | ICD-10-CM

## 2023-07-12 MED ORDER — FLUCONAZOLE 150 MG PO TABS: 150.0000 mg | ORAL_TABLET | Freq: Once | ORAL | 0 refills | Status: AC

## 2023-07-12 NOTE — Patient Instructions (Signed)
Sonya Dunn, thank you for joining [name], PA-C for today's virtual visit.  While this provider is not your primary care provider (PCP), if your PCP is located in our provider database this encounter information will be shared with them immediately following your visit.   A [facility] MyChart account gives you access to today's visit and all your visits, tests, and labs performed at [facility] " click here if you don't have a [facility] MyChart account or go to [email or web address]  Consent: (Patient) Sonya Dunn provided verbal consent for this virtual visit at the beginning of the encounter.  Current Medications:  Current Outpatient Medications:    acetaminophen (TYLENOL) 325 MG tablet, Take 2 tablets (650 mg total) by mouth every 4 (four) hours as needed (for pain scale < 4) for 5 days, then as needed. (Patient not taking: Reported on 05/31/2023), Disp: 100 tablet, Rfl: 0   cetirizine (ZYRTEC ALLERGY) 10 MG tablet, Take 1 tablet (10 mg total) by mouth daily. (Patient not taking: Reported on 05/31/2023), Disp: 30 tablet, Rfl: 11   cyclobenzaprine (FLEXERIL) 5 MG tablet, Take 1 tablet (5 mg total) by mouth every 8 (eight) hours as needed for muscle spasms. (Patient not taking: Reported on 05/31/2023), Disp: 30 tablet, Rfl: 0   famotidine (PEPCID) 20 MG tablet, Take 1 tablet (20 mg total) by mouth 2 (two) times daily. (Patient not taking: Reported on 05/31/2023), Disp: 60 tablet, Rfl: 3   ibuprofen (ADVIL) 600 MG tablet, Take 1 tablet (600 mg total) by mouth every 6 (six) hours. (Patient not taking: Reported on 05/31/2023), Disp: 30 tablet, Rfl: 0   iron polysaccharides (NIFEREX) 150 MG capsule, Take 1 capsule (150 mg total) by mouth daily. (Patient not taking: Reported on 05/31/2023), Disp: 30 capsule, Rfl: 3   metoCLOPramide (REGLAN) 5 MG tablet, Take 1 tablet (5 mg total) by mouth 4 (four) times daily -  before meals and at bedtime. (Patient not taking: Reported on  05/31/2023), Disp: 60 tablet, Rfl: 1   metroNIDAZOLE (FLAGYL) 500 MG tablet, Take 1 tablet (500 mg total) by mouth 2 (two) times daily. (Patient not taking: Reported on 05/31/2023), Disp: 14 tablet, Rfl: 2   metroNIDAZOLE (FLAGYL) 500 MG tablet, Take 1 tablet (500 mg total) by mouth 2 (two) times daily., Disp: 14 tablet, Rfl: 0   norethindrone (ORTHO MICRONOR) 0.35 MG tablet, Take 1 tablet (0.35 mg total) by mouth daily. Must take dose at the same time each day (Patient not taking: Reported on 05/31/2023), Disp: 28 tablet, Rfl: 11   ondansetron (ZOFRAN-ODT) 8 MG disintegrating tablet, Take 1 tablet (8 mg total) by mouth every 8 (eight) hours as needed for nausea or vomiting. (Patient not taking: Reported on 05/31/2023), Disp: 60 tablet, Rfl: 2   Prenatal Vit-Fe Fumarate-FA (PREPLUS) 27-1 MG TABS, Take 1 tablet by mouth daily. (Patient not taking: Reported on 05/31/2023), Disp: 30 tablet, Rfl: 13 No current facility-administered medications for this visit.  Facility-Administered Medications Ordered in Other Visits:    acetaminophen (TYLENOL) tablet 650 mg, 650 mg, Oral, Once, Corlis Hove, NP   diphenhydrAMINE (BENADRYL) capsule 25 mg, 25 mg, Oral, Once, Corlis Hove, NP   iron sucrose (VENOFER) 500 mg in sodium chloride 0.9 % 250 mL IVPB, 500 mg, Intravenous, Once, Corlis Hove, NP   Medications ordered in this encounter:  No orders of the defined types were placed in this encounter.    *If you need refills on other medications prior to your next appointment, please contact  your pharmacy*  Follow-Up: Call back or seek an in-person evaluation if the symptoms worsen or if the condition fails to improve as anticipated.  [facility] [phone]  Other Instructions Present to ER with worsening symptoms   If you have been instructed to have an in-person evaluation today at a local Urgent Care facility, please use the link below. It will take you to a list of all of our available  [facility], including address, phone number and hours of operation. Please do not delay care.  [facility]  If you or a family member do not have a primary care provider, use the link below to schedule a visit and establish care. When you choose a [facility] primary care physician or advanced practice provider, you gain a long-term partner in health. Find a Primary Care Provider  Learn more about [facility] in-office and virtual care options: [facility] - Get Care Now

## 2023-07-12 NOTE — Progress Notes (Signed)
Virtual Visit Consent   Sonya Dunn, you are scheduled for a virtual visit with a [facility] provider today. Just as with appointments in the office, your consent must be obtained to participate. Your consent will be active for this visit and any virtual visit you may have with one of our providers in the next 365 days. If you have a MyChart account, a copy of this consent can be sent to you electronically.  As this is a virtual visit, video technology does not allow for your provider to perform a traditional examination. This may limit your provider's ability to fully assess your condition. If your provider identifies any concerns that need to be evaluated in person or the need to arrange testing (such as labs, EKG, etc.), we will make arrangements to do so. Although advances in technology are sophisticated, we cannot ensure that it will always work on either your end or our end. If the connection with a video visit is poor, the visit may have to be switched to a telephone visit. With either a video or telephone visit, we are not always able to ensure that we have a secure connection.  By engaging in this virtual visit, you consent to the provision of healthcare and authorize for your insurance to be billed (if applicable) for the services provided during this visit. Depending on your insurance coverage, you may receive a charge related to this service.  I need to obtain your verbal consent now. Are you willing to proceed with your visit today? AMAZIAH GHOSH has provided verbal consent on 07/12/2023 for a virtual visit (video or telephone). Sonya Dunn, [place]  Date: 07/12/2023 3:03 PM   Virtual Visit via Video Note   I, Sonya Dunn, connected with  LASHAWNE DURA  ([id number], February 28, 2001) on 07/12/23 at  3:00 PM EST by a video-enabled telemedicine application and verified that I am speaking with the correct person using two identifiers.  Location: Patient: Virtual Visit  Location Patient: Home Provider: Virtual Visit Location Provider: Home Office   I discussed the limitations of evaluation and management by telemedicine and the availability of in person appointments. The patient expressed understanding and agreed to proceed.    History of Present Illness: Sonya Dunn is a 23 y.o. who identifies as a female who was assigned female at birth, and is being seen today for yeast infection.  HPI: Vaginal Itching The patient's primary symptoms include genital itching. This is a new problem. The current episode started yesterday. The problem occurs constantly. The problem has been unchanged. The patient is experiencing no pain. Pertinent negatives include no abdominal pain, anorexia, back pain, chills, constipation, diarrhea, discolored urine, dysuria, fever, flank pain, frequency, headaches, hematuria, joint pain, joint swelling, nausea, painful intercourse, rash, sore throat, urgency or vomiting. Nothing aggravates the symptoms. She has tried nothing for the symptoms. Her sexual activity is non-contributory to the current illness. No, her partner does not have an STD. She uses nothing for contraception. There is no history of an abdominal surgery, a Cesarean section, an ectopic pregnancy, endometriosis or ovarian cysts.    Problems:  Patient Active Problem List   Diagnosis Date Noted   Vaginal delivery 10/28/2022   Normal labor 10/26/2022   Elevated blood-pressure reading without diagnosis of hypertension 10/26/2022   Sciatica 09/29/2022   Anemia affecting pregnancy in third trimester 08/31/2022   Maternal obesity affecting pregnancy, antepartum 08/12/2022   Alpha thalassemia silent carrier 04/21/2022   Supervision of other normal pregnancy, antepartum  03/09/2022   History of pre-eclampsia 12/09/2018    Allergies: No Known Allergies Medications:  Current Outpatient Medications:    acetaminophen (TYLENOL) 325 MG tablet, Take 2 tablets (650 mg total) by  mouth every 4 (four) hours as needed (for pain scale < 4) for 5 days, then as needed. (Patient not taking: Reported on 05/31/2023), Disp: 100 tablet, Rfl: 0   cetirizine (ZYRTEC ALLERGY) 10 MG tablet, Take 1 tablet (10 mg total) by mouth daily. (Patient not taking: Reported on 05/31/2023), Disp: 30 tablet, Rfl: 11   cyclobenzaprine (FLEXERIL) 5 MG tablet, Take 1 tablet (5 mg total) by mouth every 8 (eight) hours as needed for muscle spasms. (Patient not taking: Reported on 05/31/2023), Disp: 30 tablet, Rfl: 0   famotidine (PEPCID) 20 MG tablet, Take 1 tablet (20 mg total) by mouth 2 (two) times daily. (Patient not taking: Reported on 05/31/2023), Disp: 60 tablet, Rfl: 3   ibuprofen (ADVIL) 600 MG tablet, Take 1 tablet (600 mg total) by mouth every 6 (six) hours. (Patient not taking: Reported on 05/31/2023), Disp: 30 tablet, Rfl: 0   iron polysaccharides (NIFEREX) 150 MG capsule, Take 1 capsule (150 mg total) by mouth daily. (Patient not taking: Reported on 05/31/2023), Disp: 30 capsule, Rfl: 3   metoCLOPramide (REGLAN) 5 MG tablet, Take 1 tablet (5 mg total) by mouth 4 (four) times daily -  before meals and at bedtime. (Patient not taking: Reported on 05/31/2023), Disp: 60 tablet, Rfl: 1   metroNIDAZOLE (FLAGYL) 500 MG tablet, Take 1 tablet (500 mg total) by mouth 2 (two) times daily. (Patient not taking: Reported on 05/31/2023), Disp: 14 tablet, Rfl: 2   metroNIDAZOLE (FLAGYL) 500 MG tablet, Take 1 tablet (500 mg total) by mouth 2 (two) times daily., Disp: 14 tablet, Rfl: 0   norethindrone (ORTHO MICRONOR) 0.35 MG tablet, Take 1 tablet (0.35 mg total) by mouth daily. Must take dose at the same time each day (Patient not taking: Reported on 05/31/2023), Disp: 28 tablet, Rfl: 11   ondansetron (ZOFRAN-ODT) 8 MG disintegrating tablet, Take 1 tablet (8 mg total) by mouth every 8 (eight) hours as needed for nausea or vomiting. (Patient not taking: Reported on 05/31/2023), Disp: 60 tablet, Rfl: 2   Prenatal Vit-Fe Fumarate-FA  (PREPLUS) 27-1 MG TABS, Take 1 tablet by mouth daily. (Patient not taking: Reported on 05/31/2023), Disp: 30 tablet, Rfl: 13 No current facility-administered medications for this visit.  Facility-Administered Medications Ordered in Other Visits:    acetaminophen (TYLENOL) tablet 650 mg, 650 mg, Oral, Once, Corlis Hove, NP   diphenhydrAMINE (BENADRYL) capsule 25 mg, 25 mg, Oral, Once, Corlis Hove, NP   iron sucrose (VENOFER) 500 mg in sodium chloride 0.9 % 250 mL IVPB, 500 mg, Intravenous, Once, Corlis Hove, NP  Observations/Objective: Patient is well-developed, well-nourished in no acute distress.  Resting comfortably  at home.  Head is normocephalic, atraumatic.  No labored breathing.  Speech is clear and coherent with logical content.  Patient is alert and oriented at baseline.    Assessment and Plan: 1. Yeast infection (Primary)  Patient presenting with vaginal itching most consistent with yeast vaginosis.  I also considered PID, pregnancy, ectopic pregnancy, endometriosis, tubovarian abscess, appendicitis, UTI and pyelonephritis, but this appears less likely considering the data gathered thus far.  I have instructed the patient to present to the ER at any time if there are any new or worsening symptoms.  The patient expressed understanding of and agreement with this plan.  Opportunity was given for questions  prior to discharge and all stated questions were answered to the patient's satisfaction.   Follow Up Instructions: I discussed the assessment and treatment plan with the patient. The patient was provided an opportunity to ask questions and all were answered. The patient agreed with the plan and demonstrated an understanding of the instructions.  A copy of instructions were sent to the patient via MyChart unless otherwise noted below.    The patient was advised to call back or seek an in-person evaluation if the symptoms worsen or if the condition fails to improve as  anticipated.    Sonya Kidney, PA-C

## 2023-08-24 ENCOUNTER — Telehealth

## 2023-09-21 ENCOUNTER — Telehealth: Admitting: Family Medicine

## 2023-09-21 NOTE — Progress Notes (Signed)
 The patient no-showed for appointment despite this provider sending direct link, reaching out via phone with no response and waiting for at least 10 minutes from appointment time for patient to join. They will be marked as a NS for this appointment/time.   Freddy Finner, NP

## 2023-09-22 ENCOUNTER — Telehealth: Admitting: Physician Assistant

## 2023-09-22 DIAGNOSIS — R3989 Other symptoms and signs involving the genitourinary system: Secondary | ICD-10-CM | POA: Diagnosis not present

## 2023-09-22 MED ORDER — CEPHALEXIN 500 MG PO CAPS: 500.0000 mg | ORAL_CAPSULE | Freq: Two times a day (BID) | ORAL | 0 refills | Status: AC

## 2023-09-22 NOTE — Progress Notes (Signed)
 Virtual Visit Consent   Sonya Dunn, you are scheduled for a virtual visit with a [facility] provider today. Just as with appointments in the office, your consent must be obtained to participate. Your consent will be active for this visit and any virtual visit you may have with one of our providers in the next 365 days. If you have a MyChart account, a copy of this consent can be sent to you electronically.  As this is a virtual visit, video technology does not allow for your provider to perform a traditional examination. This may limit your provider's ability to fully assess your condition. If your provider identifies any concerns that need to be evaluated in person or the need to arrange testing (such as labs, EKG, etc.), we will make arrangements to do so. Although advances in technology are sophisticated, we cannot ensure that it will always work on either your end or our end. If the connection with a video visit is poor, the visit may have to be switched to a telephone visit. With either a video or telephone visit, we are not always able to ensure that we have a secure connection.  By engaging in this virtual visit, you consent to the provision of healthcare and authorize for your insurance to be billed (if applicable) for the services provided during this visit. Depending on your insurance coverage, you may receive a charge related to this service.  I need to obtain your verbal consent now. Are you willing to proceed with your visit today? Sonya Dunn has provided verbal consent on 09/22/2023 for a virtual visit (video or telephone). Hyla Maillard, [place]  Date: 09/22/2023 2:02 PM   Virtual Visit via Video Note   I, Hyla Maillard, connected with  Sonya Dunn  ([id number], 04-Mar-2001) on 09/22/23 at  2:00 PM EDT by a video-enabled telemedicine application and verified that I am speaking with the correct person using two identifiers.  Location: Patient: Virtual  Visit Location Patient: Home Provider: Virtual Visit Location Provider: Home Office   I discussed the limitations of evaluation and management by telemedicine and the availability of in person appointments. The patient expressed understanding and agreed to proceed.    History of Present Illness: Sonya Dunn is a 23 y.o. who identifies as a female who was assigned female at birth, and is being seen today for possible UTI.  Endorses symptoms starting about 2-3 days ago with dysuria, urgency, suprapubic pain. Is emptying bladder ok. Denies fever, chills, vomiting. Denies concerns for pregnancy.   HPI: HPI  Problems:  Patient Active Problem List   Diagnosis Date Noted   Vaginal delivery 10/28/2022   Normal labor 10/26/2022   Elevated blood-pressure reading without diagnosis of hypertension 10/26/2022   Sciatica 09/29/2022   Anemia affecting pregnancy in third trimester 08/31/2022   Maternal obesity affecting pregnancy, antepartum 08/12/2022   Alpha thalassemia silent carrier 04/21/2022   Supervision of other normal pregnancy, antepartum 03/09/2022   History of pre-eclampsia 12/09/2018    Allergies: No Known Allergies Medications:  Current Outpatient Medications:    cephALEXin  (KEFLEX ) 500 MG capsule, Take 1 capsule (500 mg total) by mouth 2 (two) times daily for 7 days., Disp: 14 capsule, Rfl: 0   famotidine  (PEPCID ) 20 MG tablet, Take 1 tablet (20 mg total) by mouth 2 (two) times daily. (Patient not taking: Reported on 05/31/2023), Disp: 60 tablet, Rfl: 3   norethindrone  (ORTHO MICRONOR ) 0.35 MG tablet, Take 1 tablet (0.35 mg total) by  mouth daily. Must take dose at the same time each day (Patient not taking: Reported on 05/31/2023), Disp: 28 tablet, Rfl: 11   Prenatal Vit-Fe Fumarate-FA (PREPLUS) 27-1 MG TABS, Take 1 tablet by mouth daily. (Patient not taking: Reported on 05/31/2023), Disp: 30 tablet, Rfl: 13 No current facility-administered medications for this  visit.  Facility-Administered Medications Ordered in Other Visits:    acetaminophen  (TYLENOL ) tablet 650 mg, 650 mg, Oral, Once, Forlan, Nicole, NP   diphenhydrAMINE  (BENADRYL ) capsule 25 mg, 25 mg, Oral, Once, Forlan, Nicole, NP   iron  sucrose (VENOFER ) 500 mg in sodium chloride  0.9 % 250 mL IVPB, 500 mg, Intravenous, Once, Forlan, Nicole, NP  Observations/Objective: Patient is well-developed, well-nourished in no acute distress.  Resting comfortably  at home.  Head is normocephalic, atraumatic.  No labored breathing.  Speech is clear and coherent with logical content.  Patient is alert and oriented at baseline.   Assessment and Plan: 1. Suspected UTI (Primary) - cephALEXin  (KEFLEX ) 500 MG capsule; Take 1 capsule (500 mg total) by mouth 2 (two) times daily for 7 days.  Dispense: 14 capsule; Refill: 0  Classic UTI symptoms with absence of alarm signs or symptoms. Prior history of UTI. Will treat empirically with Keflex  for suspected uncomplicated cystitis. Supportive measures and OTC medications reviewed. Strict in-person evaluation precautions discussed.    Follow Up Instructions: I discussed the assessment and treatment plan with the patient. The patient was provided an opportunity to ask questions and all were answered. The patient agreed with the plan and demonstrated an understanding of the instructions.  A copy of instructions were sent to the patient via MyChart unless otherwise noted below.   The patient was advised to call back or seek an in-person evaluation if the symptoms worsen or if the condition fails to improve as anticipated.    Hyla Maillard, PA-C

## 2023-09-22 NOTE — Patient Instructions (Signed)
 Taffy Fabian, thank you for joining Hyla Maillard, PA-C for today's virtual visit.  While this provider is not your primary care provider (PCP), if your PCP is located in our provider database this encounter information will be shared with them immediately following your visit.   A [place] MyChart account gives you access to today's visit and all your visits, tests, and labs performed at [facility] " click here if you don't have a [place] MyChart account or go to [email or web address]  Consent: (Patient) Sonya Dunn provided verbal consent for this virtual visit at the beginning of the encounter.  Current Medications:  Current Outpatient Medications:    acetaminophen  (TYLENOL ) 325 MG tablet, Take 2 tablets (650 mg total) by mouth every 4 (four) hours as needed (for pain scale < 4) for 5 days, then as needed. (Patient not taking: Reported on 05/31/2023), Disp: 100 tablet, Rfl: 0   cetirizine  (ZYRTEC  ALLERGY) 10 MG tablet, Take 1 tablet (10 mg total) by mouth daily. (Patient not taking: Reported on 05/31/2023), Disp: 30 tablet, Rfl: 11   cyclobenzaprine  (FLEXERIL ) 5 MG tablet, Take 1 tablet (5 mg total) by mouth every 8 (eight) hours as needed for muscle spasms. (Patient not taking: Reported on 05/31/2023), Disp: 30 tablet, Rfl: 0   famotidine  (PEPCID ) 20 MG tablet, Take 1 tablet (20 mg total) by mouth 2 (two) times daily. (Patient not taking: Reported on 05/31/2023), Disp: 60 tablet, Rfl: 3   ibuprofen  (ADVIL ) 600 MG tablet, Take 1 tablet (600 mg total) by mouth every 6 (six) hours. (Patient not taking: Reported on 05/31/2023), Disp: 30 tablet, Rfl: 0   iron  polysaccharides (NIFEREX) 150 MG capsule, Take 1 capsule (150 mg total) by mouth daily. (Patient not taking: Reported on 05/31/2023), Disp: 30 capsule, Rfl: 3   metoCLOPramide  (REGLAN ) 5 MG tablet, Take 1 tablet (5 mg total) by mouth 4 (four) times daily -  before meals and at bedtime. (Patient not taking:  Reported on 05/31/2023), Disp: 60 tablet, Rfl: 1   metroNIDAZOLE  (FLAGYL ) 500 MG tablet, Take 1 tablet (500 mg total) by mouth 2 (two) times daily. (Patient not taking: Reported on 05/31/2023), Disp: 14 tablet, Rfl: 2   metroNIDAZOLE  (FLAGYL ) 500 MG tablet, Take 1 tablet (500 mg total) by mouth 2 (two) times daily., Disp: 14 tablet, Rfl: 0   norethindrone  (ORTHO MICRONOR ) 0.35 MG tablet, Take 1 tablet (0.35 mg total) by mouth daily. Must take dose at the same time each day (Patient not taking: Reported on 05/31/2023), Disp: 28 tablet, Rfl: 11   ondansetron  (ZOFRAN -ODT) 8 MG disintegrating tablet, Take 1 tablet (8 mg total) by mouth every 8 (eight) hours as needed for nausea or vomiting. (Patient not taking: Reported on 05/31/2023), Disp: 60 tablet, Rfl: 2   Prenatal Vit-Fe Fumarate-FA (PREPLUS) 27-1 MG TABS, Take 1 tablet by mouth daily. (Patient not taking: Reported on 05/31/2023), Disp: 30 tablet, Rfl: 13 No current facility-administered medications for this visit.  Facility-Administered Medications Ordered in Other Visits:    acetaminophen  (TYLENOL ) tablet 650 mg, 650 mg, Oral, Once, Forlan, Nicole, NP   diphenhydrAMINE  (BENADRYL ) capsule 25 mg, 25 mg, Oral, Once, Forlan, Nicole, NP   iron  sucrose (VENOFER ) 500 mg in sodium chloride  0.9 % 250 mL IVPB, 500 mg, Intravenous, Once, Forlan, Nicole, NP   Medications ordered in this encounter:  No orders of the defined types were placed in this encounter.    *If you need refills on other medications prior to your next appointment, please  contact your pharmacy*  Follow-Up: Call back or seek an in-person evaluation if the symptoms worsen or if the condition fails to improve as anticipated.  [facility] [phone]  Other Instructions Your symptoms are consistent with a bladder infection, also called acute cystitis. Please take your antibiotic (Keflex ) as directed until all pills are gone.  Stay very well hydrated.  Consider a daily probiotic  (Align, Culturelle, or Activia) to help prevent stomach upset caused by the antibiotic.  Taking a probiotic daily may also help prevent recurrent UTIs.  Also consider taking AZO (Phenazopyridine ) tablets to help decrease pain with urination.    Urinary Tract Infection A urinary tract infection (UTI) can occur any place along the urinary tract. The tract includes the kidneys, ureters, bladder, and urethra. A type of germ called bacteria often causes a UTI. UTIs are often helped with antibiotic medicine.  HOME CARE  If given, take antibiotics as told by your doctor. Finish them even if you start to feel better. Drink enough fluids to keep your pee (urine) clear or pale yellow. Avoid tea, drinks with caffeine , and bubbly (carbonated) drinks. Pee often. Avoid holding your pee in for a long time. Pee before and after having sex (intercourse). Wipe from front to back after you poop (bowel movement) if you are a woman. Use each tissue only once. GET HELP RIGHT AWAY IF:  You have back pain. You have lower belly (abdominal) pain. You have chills. You feel sick to your stomach (nauseous). You throw up (vomit). Your burning or discomfort with peeing does not go away. You have a fever. Your symptoms are not better in 3 days. MAKE SURE YOU:  Understand these instructions. Will watch your condition. Will get help right away if you are not doing well or get worse. Document Released: 10/28/2007 Document Revised: 02/03/2012 Document Reviewed: 12/10/2011 [facility] Patient Information 2015 [place], [place]. This information is not intended to replace advice given to you by your health care provider. Make sure you discuss any questions you have with your health care provider.    If you have been instructed to have an in-person evaluation today at a local Urgent Care facility, please use the link below. It will take you to a list of all of our available [place] [facility], including address, phone number  and hours of operation. Please do not delay care.  [place] [facility]  If you or a family member do not have a primary care provider, use the link below to schedule a visit and establish care. When you choose a [place] primary care physician or advanced practice provider, you gain a long-term partner in health. Find a Primary Care Provider  Learn more about [place]'s in-office and virtual care options: [place] - Get Care Now

## 2023-09-27 ENCOUNTER — Ambulatory Visit: Admitting: *Deleted

## 2023-09-27 DIAGNOSIS — Z3201 Encounter for pregnancy test, result positive: Secondary | ICD-10-CM

## 2023-09-27 DIAGNOSIS — Z3200 Encounter for pregnancy test, result unknown: Secondary | ICD-10-CM

## 2023-09-27 LAB — POCT URINE PREGNANCY: Preg Test, Ur: POSITIVE — AB

## 2023-09-27 MED ORDER — PREPLUS 27-1 MG PO TABS: 1.0000 | ORAL_TABLET | Freq: Every day | ORAL | 13 refills | Status: AC

## 2023-09-27 NOTE — Progress Notes (Signed)
 Sonya Dunn presents today for UPT. She has no unusual complaints.  LMP: 08/23/2023 EDD:05/29/24    OBJECTIVE: Appears well, in no apparent distress.  OB History     Gravida  2   Para  2   Term  2   Preterm      AB      Living  2      SAB      IAB      Ectopic      Multiple  0   Live Births  2          Home UPT Result: positive In-Office UPT result:positive  I have reviewed the patient's medical, obstetrical, social, and family histories, and medications.   ASSESSMENT: Positive pregnancy test  PLAN: Prenatal care to be completed at: [facility] ordered today

## 2023-10-19 ENCOUNTER — Other Ambulatory Visit: Payer: Self-pay | Admitting: *Deleted

## 2023-10-19 ENCOUNTER — Encounter: Payer: Self-pay | Admitting: *Deleted

## 2023-10-20 ENCOUNTER — Encounter

## 2023-10-26 ENCOUNTER — Telehealth: Admitting: Physician Assistant

## 2023-10-26 ENCOUNTER — Encounter: Payer: Self-pay | Admitting: Physician Assistant

## 2023-10-26 DIAGNOSIS — H43393 Other vitreous opacities, bilateral: Secondary | ICD-10-CM | POA: Diagnosis not present

## 2023-10-26 DIAGNOSIS — R42 Dizziness and giddiness: Secondary | ICD-10-CM | POA: Diagnosis not present

## 2023-10-26 DIAGNOSIS — Z8759 Personal history of other complications of pregnancy, childbirth and the puerperium: Secondary | ICD-10-CM

## 2023-10-26 DIAGNOSIS — Z3A09 9 weeks gestation of pregnancy: Secondary | ICD-10-CM | POA: Diagnosis not present

## 2023-10-26 NOTE — Progress Notes (Signed)
 Virtual Visit Consent   Sonya Dunn, you are scheduled for a virtual visit with a [facility] provider today. Just as with appointments in the office, your consent must be obtained to participate. Your consent will be active for this visit and any virtual visit you may have with one of our providers in the next 365 days. If you have a MyChart account, a copy of this consent can be sent to you electronically.  As this is a virtual visit, video technology does not allow for your provider to perform a traditional examination. This may limit your provider's ability to fully assess your condition. If your provider identifies any concerns that need to be evaluated in person or the need to arrange testing (such as labs, EKG, etc.), we will make arrangements to do so. Although advances in technology are sophisticated, we cannot ensure that it will always work on either your end or our end. If the connection with a video visit is poor, the visit may have to be switched to a telephone visit. With either a video or telephone visit, we are not always able to ensure that we have a secure connection.  By engaging in this virtual visit, you consent to the provision of healthcare and authorize for your insurance to be billed (if applicable) for the services provided during this visit. Depending on your insurance coverage, you may receive a charge related to this service.  I need to obtain your verbal consent now. Are you willing to proceed with your visit today? Sonya Dunn has provided verbal consent on 10/26/2023 for a virtual visit (video or telephone). Angelia Kelp, PA-C  Date: 10/26/2023 12:36 PM   Virtual Visit via Video Note   I, Angelia Kelp, connected with  Sonya Dunn  ([id number], [birth year]) on 10/26/23 at 12:30 PM EDT by a video-enabled telemedicine application and verified that I am speaking with the correct person using two identifiers.  Location: Patient: Virtual  Visit Location Patient: Home Provider: Virtual Visit Location Provider: Home Office   I discussed the limitations of evaluation and management by telemedicine and the availability of in person appointments. The patient expressed understanding and agreed to proceed.    History of Present Illness: Sonya Dunn is a 23 y.o. who identifies as a female who was assigned female at birth, and is being seen today for possible high blood pressure. Yesterday started feeling lightheaded, dizzy, and spots in vision. Symptoms have since resolved Has had this happen previously with having elevated blood pressures. She does have a h/o htn in pregnancy through her previous 2 pregnancies. She is currently about [redacted] weeks pregnant. She has not been able to check her BP yet.    Problems:  Patient Active Problem List   Diagnosis Date Noted   Vaginal delivery 10/28/2022   Normal labor 10/26/2022   Elevated blood-pressure reading without diagnosis of hypertension 10/26/2022   Sciatica 09/29/2022   Anemia affecting pregnancy in third trimester 08/31/2022   Maternal obesity affecting pregnancy, antepartum 08/12/2022   Alpha thalassemia silent carrier 04/21/2022   Supervision of other normal pregnancy, antepartum 03/09/2022   History of pre-eclampsia 12/09/2018    Allergies: No Known Allergies Medications:  Current Outpatient Medications:    famotidine  (PEPCID ) 20 MG tablet, Take 1 tablet (20 mg total) by mouth 2 (two) times daily. (Patient not taking: Reported on 05/31/2023), Disp: 60 tablet, Rfl: 3   Prenatal Vit-Fe Fumarate-FA (PREPLUS) 27-1 MG TABS, Take 1 tablet by mouth daily.,  Disp: 30 tablet, Rfl: 13 No current facility-administered medications for this visit.  Facility-Administered Medications Ordered in Other Visits:    acetaminophen  (TYLENOL ) tablet 650 mg, 650 mg, Oral, Once, Forlan, Nicole, NP   diphenhydrAMINE  (BENADRYL ) capsule 25 mg, 25 mg, Oral, Once, Forlan, Nicole,  NP  Observations/Objective: Patient is well-developed, well-nourished in no acute distress.  Resting comfortably at home.  Head is normocephalic, atraumatic.  No labored breathing.  Speech is clear and coherent with logical content.  Patient is alert and oriented at baseline.    Assessment and Plan: 1. Lightheaded (Primary)  2. Vitreous floaters of both eyes  3. [redacted] weeks gestation of pregnancy  4. History of gestational hypertension  - Advised to check BP at home and message with results - Will decide if needs to start Labetalol for HTN in pregnancy vs monitoring - Advised to push fluids - DASH diet - Follow up with OB/GYN - Seek in person evaluation emergently if symptoms worsen  Follow Up Instructions: I discussed the assessment and treatment plan with the patient. The patient was provided an opportunity to ask questions and all were answered. The patient agreed with the plan and demonstrated an understanding of the instructions.  A copy of instructions were sent to the patient via MyChart unless otherwise noted below.    The patient was advised to call back or seek an in-person evaluation if the symptoms worsen or if the condition fails to improve as anticipated.    Angelia Kelp, PA-C

## 2023-10-26 NOTE — Patient Instructions (Signed)
 Taffy Fabian, thank you for joining Angelia Kelp, PA-C for today's virtual visit.  While this provider is not your primary care provider (PCP), if your PCP is located in our provider database this encounter information will be shared with them immediately following your visit.   A [place] MyChart account gives you access to today's visit and all your visits, tests, and labs performed at [facility] " click here if you don't have a [facility] MyChart account or go to [email or web address]  Consent: (Patient) Sonya Dunn provided verbal consent for this virtual visit at the beginning of the encounter.  Current Medications:  Current Outpatient Medications:    famotidine  (PEPCID ) 20 MG tablet, Take 1 tablet (20 mg total) by mouth 2 (two) times daily. (Patient not taking: Reported on 05/31/2023), Disp: 60 tablet, Rfl: 3   Prenatal Vit-Fe Fumarate-FA (PREPLUS) 27-1 MG TABS, Take 1 tablet by mouth daily., Disp: 30 tablet, Rfl: 13 No current facility-administered medications for this visit.  Facility-Administered Medications Ordered in Other Visits:    acetaminophen  (TYLENOL ) tablet 650 mg, 650 mg, Oral, Once, Forlan, Nicole, NP   diphenhydrAMINE  (BENADRYL ) capsule 25 mg, 25 mg, Oral, Once, Forlan, Nicole, NP   Medications ordered in this encounter:  No orders of the defined types were placed in this encounter.    *If you need refills on other medications prior to your next appointment, please contact your pharmacy*  Follow-Up: Call back or seek an in-person evaluation if the symptoms worsen or if the condition fails to improve as anticipated.   Virtual Care [phone]  Other Instructions  High Blood Pressure During Pregnancy High blood pressure, or hypertension, is when the blood in your body moves with so much force that it could affect your health. It can cause problems for you and your baby. Three types of high blood pressure that  can happen during pregnancy. They are: Chronic high blood pressure. This is when you've had high blood pressure for a while, even before getting pregnant. It doesn't go away after your baby is born. Gestational high blood pressure. This type starts after you're [redacted] weeks pregnant and usually goes away once your baby is born. Postpartum high blood pressure. This type is most common within 1 to 2 days after delivery, but it can also happen later -- even up to 12 weeks or more after pregnancy. It can be: High blood pressure that you had before your baby was born and continues after delivery. High blood pressure that starts after you've given birth. If your blood pressure gets really high, it's an emergency. You need to be treated right away. How does high blood pressure affect me? If you had blood pressure problems during pregnancy, you're more likely to get this condition after giving birth. High blood pressure can even happen 12 weeks or more after your baby is born. You may also: Get it when you are pregnant next time. Get it later in life. In some cases, this condition can cause serious problems, such as: Heart problems, such as stroke or heart attack. Injury to kidneys, lungs, or liver. Pre-eclampsia. HELLP syndrome. Seizures. Problems with the placenta. How does high blood pressure affect my baby? Your baby may: Be born early. Have a low birth weight. Have problems during labor. This may mean a C-section could be needed quickly. What are the risks for high blood pressure during pregnancy? You're more likely to get high blood pressure during pregnancy if: You had high blood  pressure during a past pregnancy. You're overweight. You're 35 years or older. You're pregnant for the first time. You're pregnant with more than one baby. You used a fertility method, such as IVF, to get pregnant. You have other problems, such as diabetes, kidney disease, or lupus. What can I do to lower my  risk?  Keep a healthy weight. Eat a healthy diet. If you have long-term (chronic) conditions, have them treated before you become pregnant. How is this condition treated? Treatment depends on the type of high blood pressure you have and how serious it is. If you have high blood pressure, your health care provider may give you medicine to treat it and also to lessen risks to you and the baby. If you have very bad high blood pressure, you may need to stay in the hospital for treatment. If your condition gets worse, your baby may need to be born early. If you were taking medicine for your blood pressure before you got pregnant, talk with your provider. You may need to change the medicine during pregnancy if it is not safe for your baby. Follow these instructions at home: Eating and drinking Drink more fluids as told. Avoid caffeine . Lifestyle Do not use alcohol or drugs. Do not smoke, vape, or use nicotine or tobacco. Avoid stress as much as you can. Rest and get plenty of sleep. Get regular exercise. This can help to lower your blood pressure. Ask your health care provider what kinds of exercise are safe for you. General instructions Take your medicines only as told. Keep all follow-up visits. Your provider will check your blood pressure and make sure that you and your baby are healthy. Contact a health care provider if: Your baby is not moving as much as usual. You feel very tired. You feel faint or dizzy. You throw up, or feel like you may throw up. You have cramping in your belly or have pain in your hips or lower back. You have spotting or bleeding, or you leak fluid from your vagina. Get help right away if: You have chest pain or trouble breathing. You faint, have a seizure, or cannot think clearly. You have symptoms of serious problems, such as: A headache that doesn't go away when you take medicine. Very bad and sudden swelling of your face, hands, legs, or feet. Vision  problems, such as: Seeing spots. Blurry vision. Being sensitive to light. These symptoms may be an emergency. Call 911 right away. Do not wait to see if the symptoms will go away. Do not drive yourself to the hospital. This information is not intended to replace advice given to you by your health care provider. Make sure you discuss any questions you have with your health care provider. Document Revised: 03/23/2023 Document Reviewed: 11/03/2022 Elsevier Patient Education  2024 Elsevier Inc.   If you have been instructed to have an in-person evaluation today at a local Urgent Care facility, please use the link below. It will take you to a list of all of our available [place] [facility], including address, phone number and hours of operation. Please do not delay care.  [facility]  If you or a family member do not have a primary care provider, use the link below to schedule a visit and establish care. When you choose a [place] primary care physician or advanced practice provider, you gain a long-term partner in health. Find a Primary Care Provider  Learn more about [place]'s in-office and virtual  care options: [place] - Get Care Now

## 2023-11-08 ENCOUNTER — Encounter: Payer: Self-pay | Admitting: Obstetrics

## 2023-11-15 ENCOUNTER — Encounter: Admitting: Advanced Practice Midwife

## 2023-11-16 ENCOUNTER — Ambulatory Visit: Admitting: Obstetrics

## 2023-12-02 ENCOUNTER — Ambulatory Visit

## 2023-12-06 ENCOUNTER — Ambulatory Visit

## 2023-12-06 VITALS — BP 112/76 | HR 79

## 2023-12-06 DIAGNOSIS — Z3202 Encounter for pregnancy test, result negative: Secondary | ICD-10-CM

## 2023-12-06 DIAGNOSIS — Z3200 Encounter for pregnancy test, result unknown: Secondary | ICD-10-CM

## 2023-12-06 LAB — POCT URINE PREGNANCY: Preg Test, Ur: NEGATIVE

## 2023-12-06 NOTE — Progress Notes (Signed)
 Sonya Dunn here for a UPT. Pt had a positive upt at home. LMP is unknown. Pt has not had period since abortion, but has started bleeding today, could be possibly the start of cycle.      UPT in office Negative.    Pt reports having abortion on 6/3. Reports faint positives on at home test about a week ago. Test in office today negative. Consulted with Dr. Alger to be sure. Believed that the faint positives were hcg downtrending. This is most likely the start of pt first cycle since abortion. Pt to take pregnancy test after bleeding episode to ensure test is still negative.

## 2023-12-17 ENCOUNTER — Telehealth

## 2024-01-10 ENCOUNTER — Telehealth: Admitting: Family Medicine

## 2024-01-10 DIAGNOSIS — R3989 Other symptoms and signs involving the genitourinary system: Secondary | ICD-10-CM

## 2024-01-10 MED ORDER — NITROFURANTOIN MONOHYD MACRO 100 MG PO CAPS
100.0000 mg | ORAL_CAPSULE | Freq: Two times a day (BID) | ORAL | 0 refills | Status: AC
Start: 2024-01-10 — End: 2024-01-15

## 2024-01-10 NOTE — Patient Instructions (Signed)
  Sonya Dunn, thank you for joining Sonya DELENA Darby, FNP for today's virtual visit.  While this provider is not your primary care provider (PCP), if your PCP is located in our provider database this encounter information will be shared with them immediately following your visit.   A [facility] MyChart account gives you access to today's visit and all your visits, tests, and labs performed at [facility]  click here if you don't have a [facility] MyChart account or go to [email or web address]  Consent: (Patient) Sonya Dunn provided verbal consent for this virtual visit at the beginning of the encounter.  Current Medications:  Current Outpatient Medications:    nitrofurantoin , macrocrystal-monohydrate, (MACROBID ) 100 MG capsule, Take 1 capsule (100 mg total) by mouth 2 (two) times daily for 5 days., Disp: 10 capsule, Rfl: 0   famotidine  (PEPCID ) 20 MG tablet, Take 1 tablet (20 mg total) by mouth 2 (two) times daily. (Patient not taking: Reported on 05/31/2023), Disp: 60 tablet, Rfl: 3   Prenatal Vit-Fe Fumarate-FA (PREPLUS) 27-1 MG TABS, Take 1 tablet by mouth daily., Disp: 30 tablet, Rfl: 13 No current facility-administered medications for this visit.  Facility-Administered Medications Ordered in Other Visits:    acetaminophen  (TYLENOL ) tablet 650 mg, 650 mg, Oral, Once, Forlan, Nicole, NP   diphenhydrAMINE  (BENADRYL ) capsule 25 mg, 25 mg, Oral, Once, Forlan, Nicole, NP   Medications ordered in this encounter:  Meds ordered this encounter  Medications   nitrofurantoin , macrocrystal-monohydrate, (MACROBID ) 100 MG capsule    Sig: Take 1 capsule (100 mg total) by mouth 2 (two) times daily for 5 days.    Dispense:  10 capsule    Refill:  0    Supervising Provider:   BLAISE ALEENE KIDD [[id number]]     *If you need refills on other medications prior to your next appointment, please contact your pharmacy*  Follow-Up: Call back or seek an in-person evaluation if  the symptoms worsen or if the condition fails to improve as anticipated.  [facility] [phone]  Other Instructions  Suspected UTI Stay well hydrated. Handout provided in AVS  Start antibiotic today if possible. Dose must be taken >8 hours apart. Ideally 10-12 hours. If symptoms are not improving over the next few days or are worsening I would recommend being seen in person for exam and urine testing.   If you have been instructed to have an in-person evaluation today at a local Urgent Care facility, please use the link below. It will take you to a list of all of our available [place] [facility], including address, phone number and hours of operation. Please do not delay care.  [facility]  If you or a family member do not have a primary care provider, use the link below to schedule a visit and establish care. When you choose a [place] primary care physician or advanced practice provider, you gain a long-term partner in health. Find a Primary Care Provider  Learn more about [place]'s in-office and virtual care options: [place] - Get Care Now

## 2024-01-10 NOTE — Progress Notes (Signed)
 Virtual Visit Consent   Sonya Dunn, you are scheduled for a virtual visit with a [facility] provider today. Just as with appointments in the office, your consent must be obtained to participate. Your consent will be active for this visit and any virtual visit you may have with one of our providers in the next 365 days. If you have a MyChart account, a copy of this consent can be sent to you electronically.  As this is a virtual visit, video technology does not allow for your provider to perform a traditional examination. This may limit your provider's ability to fully assess your condition. If your provider identifies any concerns that need to be evaluated in person or the need to arrange testing (such as labs, EKG, etc.), we will make arrangements to do so. Although advances in technology are sophisticated, we cannot ensure that it will always work on either your end or our end. If the connection with a video visit is poor, the visit may have to be switched to a telephone visit. With either a video or telephone visit, we are not always able to ensure that we have a secure connection.  By engaging in this virtual visit, you consent to the provision of healthcare and authorize for your insurance to be billed (if applicable) for the services provided during this visit. Depending on your insurance coverage, you may receive a charge related to this service.  I need to obtain your verbal consent now. Are you willing to proceed with your visit today? Sonya Dunn has provided verbal consent on 01/10/2024 for a virtual visit (video or telephone). Olam DELENA Darby, FNP  Date: 01/10/2024 12:40 PM   Virtual Visit via Video Note   I, Olam DELENA Darby, connected with  Sonya Dunn  ([id number], 04-24-2001) on 01/10/24 at 11:15 AM EDT by a video-enabled telemedicine application and verified that I am speaking with the correct person using two identifiers.  Location: Patient: Home Provider:  Virtual Visit Location Provider: Home Office   I discussed the limitations of evaluation and management by telemedicine and the availability of in person appointments. The patient expressed understanding and agreed to proceed.    History of Present Illness: Sonya Dunn is a 23 y.o. female and is being seen today for aching in pelvic region. Denies pain with urination. Frequent urination. No incontinence. Denies blood in urine, or fever. Slight low back ache. Symptoms going on for four days. Denies any vaginal discharge. Menstrual cycle just ended. Symptoms are not worsening but are also not improving. Feels the same as UTI's she has had in the past.  HPI:  Problems:  Patient Active Problem List   Diagnosis Date Noted   Vaginal delivery 10/28/2022   Normal labor 10/26/2022   Elevated blood-pressure reading without diagnosis of hypertension 10/26/2022   Sciatica 09/29/2022   Anemia affecting pregnancy in third trimester 08/31/2022   Maternal obesity affecting pregnancy, antepartum 08/12/2022   Alpha thalassemia silent carrier 04/21/2022   Supervision of other normal pregnancy, antepartum 03/09/2022   History of pre-eclampsia 12/09/2018    Allergies: No Known Allergies Medications:  Current Outpatient Medications:    nitrofurantoin , macrocrystal-monohydrate, (MACROBID ) 100 MG capsule, Take 1 capsule (100 mg total) by mouth 2 (two) times daily for 5 days., Disp: 10 capsule, Rfl: 0   famotidine  (PEPCID ) 20 MG tablet, Take 1 tablet (20 mg total) by mouth 2 (two) times daily. (Patient not taking: Reported on 05/31/2023), Disp: 60 tablet, Rfl: 3  Prenatal Vit-Fe Fumarate-FA (PREPLUS) 27-1 MG TABS, Take 1 tablet by mouth daily., Disp: 30 tablet, Rfl: 13 No current facility-administered medications for this visit.  Facility-Administered Medications Ordered in Other Visits:    acetaminophen  (TYLENOL ) tablet 650 mg, 650 mg, Oral, Once, Forlan, Nicole, NP   diphenhydrAMINE  (BENADRYL ) capsule  25 mg, 25 mg, Oral, Once, Forlan, Nicole, NP  Observations/Objective: Patient is well-developed, well-nourished in no acute distress.  Resting comfortably  at home.  Head is normocephalic, atraumatic.  No labored breathing.  Speech is clear and coherent with logical content.  Patient is alert and oriented at baseline.   Assessment and Plan: 1. Suspected UTI (Primary) - nitrofurantoin , macrocrystal-monohydrate, (MACROBID ) 100 MG capsule; Take 1 capsule (100 mg total) by mouth 2 (two) times daily for 5 days.  Dispense: 10 capsule; Refill: 0  Suspected UTI Stay well hydrated. Handout provided in AVS  Start antibiotic today if possible. Dose must be taken >8 hours apart. Ideally 10-12 hours. If symptoms are not improving over the next few days or are worsening I would recommend being seen in person for exam and urine testing.  Follow Up Instructions: I discussed the assessment and treatment plan with the patient. The patient was provided an opportunity to ask questions and all were answered. The patient agreed with the plan and demonstrated an understanding of the instructions.  A copy of instructions were sent to the patient via MyChart unless otherwise noted below.   The patient was advised to call back or seek an in-person evaluation if the symptoms worsen or if the condition fails to improve as anticipated.    Olam DELENA Darby, FNP

## 2024-04-11 ENCOUNTER — Telehealth

## 2024-04-15 ENCOUNTER — Telehealth: Admitting: Physician Assistant

## 2024-04-15 NOTE — Progress Notes (Signed)
 The patient no-showed for appointment despite this provider sending direct link, reaching out via phone with no response and waiting for at least 10 minutes from appointment time for patient to join. They will be marked as a NS for this appointment/time.   [name], PA-C

## 2024-06-19 ENCOUNTER — Other Ambulatory Visit (HOSPITAL_COMMUNITY): Payer: Self-pay
